# Patient Record
Sex: Female | Born: 1935 | Race: White | Hispanic: No | Smoking: Never smoker
Health system: Southern US, Community
[De-identification: ages and names within clinical notes are randomized; demographics above are authoritative.]

## PROBLEM LIST (undated history)

## (undated) DIAGNOSIS — J45909 Unspecified asthma, uncomplicated: Secondary | ICD-10-CM

## (undated) DIAGNOSIS — I10 Essential (primary) hypertension: Secondary | ICD-10-CM

## (undated) DIAGNOSIS — K5790 Diverticulosis of intestine, part unspecified, without perforation or abscess without bleeding: Secondary | ICD-10-CM

## (undated) DIAGNOSIS — Z9981 Dependence on supplemental oxygen: Secondary | ICD-10-CM

## (undated) DIAGNOSIS — I4891 Unspecified atrial fibrillation: Secondary | ICD-10-CM

## (undated) DIAGNOSIS — I2510 Atherosclerotic heart disease of native coronary artery without angina pectoris: Secondary | ICD-10-CM

## (undated) DIAGNOSIS — I509 Heart failure, unspecified: Secondary | ICD-10-CM

## (undated) DIAGNOSIS — F0390 Unspecified dementia without behavioral disturbance: Secondary | ICD-10-CM

## (undated) HISTORY — PX: ABDOMINAL HYSTERECTOMY: SUR658

## (undated) HISTORY — PX: BREAST LUMPECTOMY: SHX2

---

## 2020-04-18 ENCOUNTER — Inpatient Hospital Stay (HOSPITAL_COMMUNITY)
Admission: EM | Admit: 2020-04-18 | Discharge: 2020-04-26 | DRG: 981 | Disposition: E | Payer: Medicare Other | Attending: Neurology | Admitting: Neurology

## 2020-04-18 ENCOUNTER — Other Ambulatory Visit: Payer: Self-pay

## 2020-04-18 DIAGNOSIS — I110 Hypertensive heart disease with heart failure: Secondary | ICD-10-CM | POA: Diagnosis present

## 2020-04-18 DIAGNOSIS — F0390 Unspecified dementia without behavioral disturbance: Secondary | ICD-10-CM | POA: Diagnosis present

## 2020-04-18 DIAGNOSIS — K5731 Diverticulosis of large intestine without perforation or abscess with bleeding: Principal | ICD-10-CM | POA: Diagnosis present

## 2020-04-18 DIAGNOSIS — J45909 Unspecified asthma, uncomplicated: Secondary | ICD-10-CM | POA: Diagnosis present

## 2020-04-18 DIAGNOSIS — Z66 Do not resuscitate: Secondary | ICD-10-CM | POA: Diagnosis not present

## 2020-04-18 DIAGNOSIS — R278 Other lack of coordination: Secondary | ICD-10-CM | POA: Diagnosis not present

## 2020-04-18 DIAGNOSIS — Z20822 Contact with and (suspected) exposure to covid-19: Secondary | ICD-10-CM | POA: Diagnosis present

## 2020-04-18 DIAGNOSIS — I63511 Cerebral infarction due to unspecified occlusion or stenosis of right middle cerebral artery: Secondary | ICD-10-CM | POA: Diagnosis present

## 2020-04-18 DIAGNOSIS — R471 Dysarthria and anarthria: Secondary | ICD-10-CM | POA: Diagnosis not present

## 2020-04-18 DIAGNOSIS — G8194 Hemiplegia, unspecified affecting left nondominant side: Secondary | ICD-10-CM | POA: Diagnosis not present

## 2020-04-18 DIAGNOSIS — R7989 Other specified abnormal findings of blood chemistry: Secondary | ICD-10-CM | POA: Diagnosis present

## 2020-04-18 DIAGNOSIS — E871 Hypo-osmolality and hyponatremia: Secondary | ICD-10-CM

## 2020-04-18 DIAGNOSIS — Z515 Encounter for palliative care: Secondary | ICD-10-CM | POA: Diagnosis not present

## 2020-04-18 DIAGNOSIS — Z79899 Other long term (current) drug therapy: Secondary | ICD-10-CM

## 2020-04-18 DIAGNOSIS — N289 Disorder of kidney and ureter, unspecified: Secondary | ICD-10-CM | POA: Diagnosis present

## 2020-04-18 DIAGNOSIS — R29810 Facial weakness: Secondary | ICD-10-CM | POA: Diagnosis not present

## 2020-04-18 DIAGNOSIS — I4821 Permanent atrial fibrillation: Secondary | ICD-10-CM

## 2020-04-18 DIAGNOSIS — E039 Hypothyroidism, unspecified: Secondary | ICD-10-CM | POA: Diagnosis present

## 2020-04-18 DIAGNOSIS — Z7951 Long term (current) use of inhaled steroids: Secondary | ICD-10-CM

## 2020-04-18 DIAGNOSIS — I63411 Cerebral infarction due to embolism of right middle cerebral artery: Secondary | ICD-10-CM | POA: Diagnosis not present

## 2020-04-18 DIAGNOSIS — I6601 Occlusion and stenosis of right middle cerebral artery: Secondary | ICD-10-CM | POA: Diagnosis present

## 2020-04-18 DIAGNOSIS — I739 Peripheral vascular disease, unspecified: Secondary | ICD-10-CM | POA: Diagnosis present

## 2020-04-18 DIAGNOSIS — J9601 Acute respiratory failure with hypoxia: Secondary | ICD-10-CM | POA: Diagnosis not present

## 2020-04-18 DIAGNOSIS — I509 Heart failure, unspecified: Secondary | ICD-10-CM | POA: Diagnosis present

## 2020-04-18 DIAGNOSIS — K5790 Diverticulosis of intestine, part unspecified, without perforation or abscess without bleeding: Secondary | ICD-10-CM | POA: Diagnosis present

## 2020-04-18 DIAGNOSIS — Z91041 Radiographic dye allergy status: Secondary | ICD-10-CM

## 2020-04-18 DIAGNOSIS — Z79890 Hormone replacement therapy (postmenopausal): Secondary | ICD-10-CM

## 2020-04-18 DIAGNOSIS — K922 Gastrointestinal hemorrhage, unspecified: Secondary | ICD-10-CM

## 2020-04-18 DIAGNOSIS — I4891 Unspecified atrial fibrillation: Secondary | ICD-10-CM | POA: Diagnosis present

## 2020-04-18 DIAGNOSIS — D62 Acute posthemorrhagic anemia: Secondary | ICD-10-CM | POA: Diagnosis present

## 2020-04-18 DIAGNOSIS — D500 Iron deficiency anemia secondary to blood loss (chronic): Secondary | ICD-10-CM

## 2020-04-18 DIAGNOSIS — Z9981 Dependence on supplemental oxygen: Secondary | ICD-10-CM

## 2020-04-18 DIAGNOSIS — E785 Hyperlipidemia, unspecified: Secondary | ICD-10-CM | POA: Diagnosis present

## 2020-04-18 DIAGNOSIS — R29706 NIHSS score 6: Secondary | ICD-10-CM | POA: Diagnosis not present

## 2020-04-18 DIAGNOSIS — I10 Essential (primary) hypertension: Secondary | ICD-10-CM | POA: Diagnosis present

## 2020-04-18 DIAGNOSIS — R4701 Aphasia: Secondary | ICD-10-CM | POA: Diagnosis not present

## 2020-04-18 DIAGNOSIS — Z0189 Encounter for other specified special examinations: Secondary | ICD-10-CM

## 2020-04-18 DIAGNOSIS — Z7982 Long term (current) use of aspirin: Secondary | ICD-10-CM

## 2020-04-18 DIAGNOSIS — T81509A Unspecified complication of foreign body accidentally left in body following unspecified procedure, initial encounter: Secondary | ICD-10-CM

## 2020-04-18 DIAGNOSIS — D75839 Thrombocytosis, unspecified: Secondary | ICD-10-CM

## 2020-04-18 DIAGNOSIS — I6340 Cerebral infarction due to embolism of unspecified cerebral artery: Secondary | ICD-10-CM | POA: Diagnosis not present

## 2020-04-18 DIAGNOSIS — I639 Cerebral infarction, unspecified: Secondary | ICD-10-CM

## 2020-04-18 DIAGNOSIS — I2510 Atherosclerotic heart disease of native coronary artery without angina pectoris: Secondary | ICD-10-CM | POA: Diagnosis present

## 2020-04-18 HISTORY — DX: Heart failure, unspecified: I50.9

## 2020-04-18 HISTORY — DX: Atherosclerotic heart disease of native coronary artery without angina pectoris: I25.10

## 2020-04-18 HISTORY — DX: Diverticulosis of intestine, part unspecified, without perforation or abscess without bleeding: K57.90

## 2020-04-18 HISTORY — DX: Unspecified atrial fibrillation: I48.91

## 2020-04-18 HISTORY — DX: Dependence on supplemental oxygen: Z99.81

## 2020-04-18 HISTORY — DX: Unspecified asthma, uncomplicated: J45.909

## 2020-04-18 HISTORY — DX: Unspecified dementia, unspecified severity, without behavioral disturbance, psychotic disturbance, mood disturbance, and anxiety: F03.90

## 2020-04-18 HISTORY — DX: Essential (primary) hypertension: I10

## 2020-04-18 NOTE — ED Triage Notes (Signed)
Pt reports GI bleeding and weakness.  According to family member, pt has had several hospitalizations for the same in the past three weeks, stating lowest hgb was 4.1.  Pt does not demonstrate any neuro deficits at this time.  She was taken off her blood thinners 1 week ago.

## 2020-04-19 ENCOUNTER — Telehealth: Payer: Self-pay

## 2020-04-19 ENCOUNTER — Encounter (HOSPITAL_COMMUNITY): Payer: Self-pay | Admitting: Internal Medicine

## 2020-04-19 DIAGNOSIS — Z9981 Dependence on supplemental oxygen: Secondary | ICD-10-CM

## 2020-04-19 DIAGNOSIS — D62 Acute posthemorrhagic anemia: Secondary | ICD-10-CM | POA: Diagnosis present

## 2020-04-19 DIAGNOSIS — I2510 Atherosclerotic heart disease of native coronary artery without angina pectoris: Secondary | ICD-10-CM | POA: Diagnosis present

## 2020-04-19 DIAGNOSIS — K5731 Diverticulosis of large intestine without perforation or abscess with bleeding: Secondary | ICD-10-CM | POA: Diagnosis present

## 2020-04-19 DIAGNOSIS — E871 Hypo-osmolality and hyponatremia: Secondary | ICD-10-CM | POA: Diagnosis present

## 2020-04-19 DIAGNOSIS — F0390 Unspecified dementia without behavioral disturbance: Secondary | ICD-10-CM | POA: Diagnosis present

## 2020-04-19 DIAGNOSIS — J45909 Unspecified asthma, uncomplicated: Secondary | ICD-10-CM | POA: Diagnosis present

## 2020-04-19 DIAGNOSIS — I110 Hypertensive heart disease with heart failure: Secondary | ICD-10-CM | POA: Diagnosis present

## 2020-04-19 DIAGNOSIS — N289 Disorder of kidney and ureter, unspecified: Secondary | ICD-10-CM | POA: Diagnosis present

## 2020-04-19 DIAGNOSIS — D500 Iron deficiency anemia secondary to blood loss (chronic): Secondary | ICD-10-CM

## 2020-04-19 DIAGNOSIS — E039 Hypothyroidism, unspecified: Secondary | ICD-10-CM | POA: Diagnosis present

## 2020-04-19 DIAGNOSIS — I63511 Cerebral infarction due to unspecified occlusion or stenosis of right middle cerebral artery: Secondary | ICD-10-CM | POA: Diagnosis not present

## 2020-04-19 DIAGNOSIS — G8194 Hemiplegia, unspecified affecting left nondominant side: Secondary | ICD-10-CM | POA: Diagnosis not present

## 2020-04-19 DIAGNOSIS — I509 Heart failure, unspecified: Secondary | ICD-10-CM

## 2020-04-19 DIAGNOSIS — Z66 Do not resuscitate: Secondary | ICD-10-CM | POA: Diagnosis not present

## 2020-04-19 DIAGNOSIS — K5790 Diverticulosis of intestine, part unspecified, without perforation or abscess without bleeding: Secondary | ICD-10-CM | POA: Diagnosis not present

## 2020-04-19 DIAGNOSIS — R4701 Aphasia: Secondary | ICD-10-CM | POA: Diagnosis not present

## 2020-04-19 DIAGNOSIS — J9601 Acute respiratory failure with hypoxia: Secondary | ICD-10-CM | POA: Diagnosis not present

## 2020-04-19 DIAGNOSIS — K922 Gastrointestinal hemorrhage, unspecified: Secondary | ICD-10-CM

## 2020-04-19 DIAGNOSIS — R4182 Altered mental status, unspecified: Secondary | ICD-10-CM | POA: Diagnosis not present

## 2020-04-19 DIAGNOSIS — I4891 Unspecified atrial fibrillation: Secondary | ICD-10-CM | POA: Diagnosis present

## 2020-04-19 DIAGNOSIS — R278 Other lack of coordination: Secondary | ICD-10-CM | POA: Diagnosis not present

## 2020-04-19 DIAGNOSIS — Z20822 Contact with and (suspected) exposure to covid-19: Secondary | ICD-10-CM | POA: Diagnosis present

## 2020-04-19 DIAGNOSIS — E785 Hyperlipidemia, unspecified: Secondary | ICD-10-CM | POA: Diagnosis present

## 2020-04-19 DIAGNOSIS — I739 Peripheral vascular disease, unspecified: Secondary | ICD-10-CM | POA: Diagnosis present

## 2020-04-19 DIAGNOSIS — I6340 Cerebral infarction due to embolism of unspecified cerebral artery: Secondary | ICD-10-CM | POA: Diagnosis not present

## 2020-04-19 DIAGNOSIS — Z515 Encounter for palliative care: Secondary | ICD-10-CM | POA: Diagnosis not present

## 2020-04-19 DIAGNOSIS — I4821 Permanent atrial fibrillation: Secondary | ICD-10-CM | POA: Diagnosis present

## 2020-04-19 DIAGNOSIS — I63411 Cerebral infarction due to embolism of right middle cerebral artery: Secondary | ICD-10-CM | POA: Diagnosis not present

## 2020-04-19 DIAGNOSIS — R29706 NIHSS score 6: Secondary | ICD-10-CM | POA: Diagnosis not present

## 2020-04-19 DIAGNOSIS — I639 Cerebral infarction, unspecified: Secondary | ICD-10-CM | POA: Diagnosis not present

## 2020-04-19 DIAGNOSIS — I10 Essential (primary) hypertension: Secondary | ICD-10-CM | POA: Diagnosis present

## 2020-04-19 HISTORY — DX: Gastrointestinal hemorrhage, unspecified: K92.2

## 2020-04-19 LAB — CBC
HCT: 23.2 % — ABNORMAL LOW (ref 36.0–46.0)
HCT: 30.6 % — ABNORMAL LOW (ref 36.0–46.0)
Hemoglobin: 7.2 g/dL — ABNORMAL LOW (ref 12.0–15.0)
Hemoglobin: 9.9 g/dL — ABNORMAL LOW (ref 12.0–15.0)
MCH: 28.9 pg (ref 26.0–34.0)
MCH: 29.8 pg (ref 26.0–34.0)
MCHC: 31 g/dL (ref 30.0–36.0)
MCHC: 32.4 g/dL (ref 30.0–36.0)
MCV: 89.5 fL (ref 80.0–100.0)
MCV: 95.9 fL (ref 80.0–100.0)
Platelets: 336 10*3/uL (ref 150–400)
Platelets: 413 10*3/uL — ABNORMAL HIGH (ref 150–400)
RBC: 2.42 MIL/uL — ABNORMAL LOW (ref 3.87–5.11)
RBC: 3.42 MIL/uL — ABNORMAL LOW (ref 3.87–5.11)
RDW: 15.2 % (ref 11.5–15.5)
RDW: 16.2 % — ABNORMAL HIGH (ref 11.5–15.5)
WBC: 6.9 10*3/uL (ref 4.0–10.5)
WBC: 7.8 10*3/uL (ref 4.0–10.5)
nRBC: 0 % (ref 0.0–0.2)
nRBC: 0 % (ref 0.0–0.2)

## 2020-04-19 LAB — RESPIRATORY PANEL BY RT PCR (FLU A&B, COVID)
Influenza A by PCR: NEGATIVE
Influenza B by PCR: NEGATIVE
SARS Coronavirus 2 by RT PCR: NEGATIVE

## 2020-04-19 LAB — TSH: TSH: 6.325 u[IU]/mL — ABNORMAL HIGH (ref 0.350–4.500)

## 2020-04-19 LAB — BASIC METABOLIC PANEL
Anion gap: 10 (ref 5–15)
BUN: 21 mg/dL (ref 8–23)
CO2: 27 mmol/L (ref 22–32)
Calcium: 9.1 mg/dL (ref 8.9–10.3)
Chloride: 95 mmol/L — ABNORMAL LOW (ref 98–111)
Creatinine, Ser: 1.25 mg/dL — ABNORMAL HIGH (ref 0.44–1.00)
GFR calc Af Amer: 45 mL/min — ABNORMAL LOW (ref >60)
GFR calc non Af Amer: 39 mL/min — ABNORMAL LOW (ref >60)
Glucose, Bld: 119 mg/dL — ABNORMAL HIGH (ref 70–99)
Potassium: 4.7 mmol/L (ref 3.5–5.1)
Sodium: 132 mmol/L — ABNORMAL LOW (ref 135–145)

## 2020-04-19 LAB — HEMOGLOBIN AND HEMATOCRIT, BLOOD
HCT: 22.7 % — ABNORMAL LOW (ref 36.0–46.0)
Hemoglobin: 6.8 g/dL — CL (ref 12.0–15.0)

## 2020-04-19 LAB — DIGOXIN LEVEL: Digoxin Level: 2 ng/mL (ref 1.0–2.0)

## 2020-04-19 LAB — ABO/RH: ABO/RH(D): O POS

## 2020-04-19 LAB — PREPARE RBC (CROSSMATCH)

## 2020-04-19 LAB — PROTIME-INR
INR: 1 (ref 0.8–1.2)
Prothrombin Time: 13.1 seconds (ref 11.4–15.2)

## 2020-04-19 LAB — POC OCCULT BLOOD, ED: Fecal Occult Bld: POSITIVE — AB

## 2020-04-19 MED ORDER — ACETAMINOPHEN 650 MG RE SUPP: 650.0000 mg | Freq: Four times a day (QID) | RECTAL | Status: DC | PRN

## 2020-04-19 MED ORDER — PEG-KCL-NACL-NASULF-NA ASC-C 100 G PO SOLR: 1.0000 | Freq: Once | ORAL | Status: DC

## 2020-04-19 MED ORDER — HYDRALAZINE HCL 20 MG/ML IJ SOLN
5.0000 mg | INTRAMUSCULAR | Status: DC | PRN
  Administered 2020-04-20: 5 mg via INTRAVENOUS
  Filled 2020-04-19: qty 1

## 2020-04-19 MED ORDER — FLUOXETINE HCL 20 MG PO CAPS
40.0000 mg | ORAL_CAPSULE | Freq: Every day | ORAL | Status: DC
  Administered 2020-04-19 – 2020-04-22 (×3): 40 mg via ORAL
  Filled 2020-04-19 (×4): qty 2

## 2020-04-19 MED ORDER — SODIUM CHLORIDE 0.9 % IV SOLN
10.0000 mL/h | Freq: Once | INTRAVENOUS | Status: AC
  Administered 2020-04-19: 10 mL/h via INTRAVENOUS

## 2020-04-19 MED ORDER — LEVOTHYROXINE SODIUM 88 MCG PO TABS
88.0000 ug | ORAL_TABLET | Freq: Every day | ORAL | Status: DC
  Administered 2020-04-20 – 2020-04-23 (×3): 88 ug via ORAL
  Filled 2020-04-19 (×3): qty 1

## 2020-04-19 MED ORDER — ALBUTEROL SULFATE HFA 108 (90 BASE) MCG/ACT IN AERS
2.0000 | INHALATION_SPRAY | RESPIRATORY_TRACT | Status: DC | PRN
  Filled 2020-04-19: qty 6.7

## 2020-04-19 MED ORDER — DIGOXIN 125 MCG PO TABS
0.1250 mg | ORAL_TABLET | Freq: Every day | ORAL | Status: DC
  Administered 2020-04-19: 0.125 mg via ORAL
  Filled 2020-04-19: qty 1

## 2020-04-19 MED ORDER — PEG-KCL-NACL-NASULF-NA ASC-C 100 G PO SOLR
0.5000 | Freq: Once | ORAL | Status: AC
  Administered 2020-04-19: 100 g via ORAL
  Filled 2020-04-19: qty 1

## 2020-04-19 MED ORDER — PRAVASTATIN SODIUM 40 MG PO TABS
40.0000 mg | ORAL_TABLET | Freq: Every day | ORAL | Status: DC
  Administered 2020-04-19 – 2020-04-23 (×5): 40 mg via ORAL
  Filled 2020-04-19 (×5): qty 1

## 2020-04-19 MED ORDER — BISOPROLOL FUMARATE 5 MG PO TABS
10.0000 mg | ORAL_TABLET | Freq: Every day | ORAL | Status: DC
  Administered 2020-04-19: 10 mg via ORAL
  Filled 2020-04-19: qty 1

## 2020-04-19 MED ORDER — LACTATED RINGERS IV SOLN: INTRAVENOUS | Status: DC

## 2020-04-19 MED ORDER — ONDANSETRON HCL 4 MG/2ML IJ SOLN: 4.0000 mg | Freq: Four times a day (QID) | INTRAMUSCULAR | Status: DC | PRN

## 2020-04-19 MED ORDER — PANTOPRAZOLE SODIUM 40 MG IV SOLR
40.0000 mg | INTRAVENOUS | Status: DC
  Administered 2020-04-19 – 2020-04-24 (×6): 40 mg via INTRAVENOUS
  Filled 2020-04-19 (×6): qty 40

## 2020-04-19 MED ORDER — SIMETHICONE 80 MG PO CHEW
80.0000 mg | CHEWABLE_TABLET | Freq: Four times a day (QID) | ORAL | Status: DC | PRN
  Filled 2020-04-19: qty 1

## 2020-04-19 MED ORDER — ACETAMINOPHEN 325 MG PO TABS
650.0000 mg | ORAL_TABLET | Freq: Four times a day (QID) | ORAL | Status: DC | PRN
  Administered 2020-04-19 – 2020-04-23 (×6): 650 mg via ORAL
  Filled 2020-04-19 (×6): qty 2

## 2020-04-19 MED ORDER — MOMETASONE FURO-FORMOTEROL FUM 200-5 MCG/ACT IN AERO
2.0000 | INHALATION_SPRAY | Freq: Two times a day (BID) | RESPIRATORY_TRACT | Status: DC
  Administered 2020-04-19 – 2020-04-23 (×6): 2 via RESPIRATORY_TRACT
  Filled 2020-04-19 (×2): qty 8.8

## 2020-04-19 MED ORDER — MORPHINE SULFATE (PF) 2 MG/ML IV SOLN: 2.0000 mg | INTRAVENOUS | Status: DC | PRN

## 2020-04-19 MED ORDER — FUROSEMIDE 10 MG/ML IJ SOLN
20.0000 mg | Freq: Once | INTRAMUSCULAR | Status: AC
  Administered 2020-04-19: 20 mg via INTRAVENOUS
  Filled 2020-04-19: qty 2

## 2020-04-19 MED ORDER — ONDANSETRON HCL 4 MG PO TABS: 4.0000 mg | ORAL_TABLET | Freq: Four times a day (QID) | ORAL | Status: DC | PRN

## 2020-04-19 NOTE — Social Work (Signed)
CSW acknowledging consult for SNF placement. Will follow for therapy recommendations needed to best determine disposition/for insurance authorization.   Amelia Macken, MSW, LCSW [facility]    

## 2020-04-19 NOTE — ED Notes (Signed)
Blood Bank has called this RN informing that units of blood for pt is ready & available.

## 2020-04-19 NOTE — Consult Note (Signed)
Consultation  Referring Provider: TRH/Yates  primary Care Physician:  Alben Deeds, MD Primary Gastroenterologist:  [facility]  Reason for Consultation: GI bleed  HPI: Victoria Oconnell is a 86 y.o. female, from [place], who was admitted through the emergency room last p.m. after being brought in by her family with complaints of weakness and lightheadedness and concerns for persistent bleeding. Patient has history of coronary artery disease status post previous stents, congestive heart failure, nocturnal oxygen dependence, and history of atrial fibrillation for which she had been on Xarelto. He has had 2 very recent admissions in [place].  The first admission was about 3 weeks ago with acute presumed lower GI bleeding with bloody stools.  Records are not available as yet, patient's daughter-in-law gives history. She underwent colonoscopy and EGD and colonoscopy with the only finding of diverticulosis.  She was supposed to remain off of Xarelto but inadvertently started taking Xarelto after she was discharged and had readmission about 2 weeks ago with hemoglobin down to 4.9.  By daughter-in-law's report patient underwent 2 colonoscopies during that admission, did have a polyp removed and again no finding other than diverticulosis.  She did not have bleeding scan or angiography.  She was discharged last weekend.  Hemoglobin was 9.5 on discharge. she had follow-up CBC through her PCP on Tuesday, 04/16/2020 and hemoglobin above 8. She has not been on anticoagulation since this last discharge.  Patient called her daughter-in-law yesterday afternoon stating that she was not feeling well, was feeling more weak, and lightheaded and was worried that her blood pressure was low.  Her daughter-in-law decided to bring her to [facility] for further evaluation.  Patient has not been aware of having any obvious rectal bleeding over the past week but thinks that her stools have been dark.  On  admit last p.m. hemoglobin 7.2/hematocrit 23.2, this a.m. hemoglobin down to 6.8. She is receiving 2 units of packed RBCs. She has had 1 bowel movement since admission-patient unaware if any frank blood. She denies any abdominal pain or discomfort, no nausea or vomiting.  She has been gassy. Hemodynamically stable since admission.    Past Medical History:  Diagnosis Date  . Asthma   . Atrial fibrillation (HCC)   . CAD (coronary artery disease)    has stent  . CHF (congestive heart failure) (HCC)   . Dementia (HCC)    mild  . Dependence on nocturnal oxygen therapy    uncertain etiology  . Diverticulosis   . Essential hypertension     Past Surgical History:  Procedure Laterality Date  . ABDOMINAL HYSTERECTOMY    . BREAST LUMPECTOMY      Prior to Admission medications   Medication Sig Start Date End Date Taking? Authorizing Provider  albuterol (VENTOLIN HFA) 108 (90 Base) MCG/ACT inhaler Inhale 2 puffs into the lungs every 4 (four) hours as needed for wheezing or shortness of breath.   Yes [provider]  aspirin EC 81 MG tablet Take 81 mg by mouth daily. Swallow whole.   Yes [provider]  bisoprolol (ZEBETA) 10 MG tablet Take 10 mg by mouth daily.   Yes [provider]  budesonide-formoterol (SYMBICORT) 160-4.5 MCG/ACT inhaler Inhale 2 puffs into the lungs 2 (two) times daily.   Yes [provider]  digoxin (LANOXIN) 0.125 MG tablet Take 0.125 mg by mouth daily.   Yes [provider]  FLUoxetine (PROZAC) 40 MG capsule Take 40 mg by mouth daily.   Yes [provider]  fluticasone (FLOVENT DISKUS) 50 MCG/BLIST diskus inhaler Inhale 2 puffs into the lungs 2 (two) times daily.   Yes [provider]  furosemide (LASIX) 20 MG tablet Take 20 mg by mouth daily.   Yes [provider]  levothyroxine (SYNTHROID) 88 MCG tablet Take 88 mcg by mouth daily before breakfast.   Yes [provider]  losartan  (COZAAR) 50 MG tablet Take 50 mg by mouth daily.   Yes [provider]  magnesium hydroxide (MILK OF MAGNESIA) 400 MG/5ML suspension Take 30 mLs by mouth daily as needed for mild constipation.   Yes [provider]  omeprazole (PRILOSEC) 20 MG capsule Take 20 mg by mouth daily.   Yes [provider]  pravastatin (PRAVACHOL) 40 MG tablet Take 40 mg by mouth at bedtime.   Yes [provider]  simethicone (MYLICON) 125 MG chewable tablet Chew 125 mg by mouth 3 (three) times daily as needed for flatulence.   Yes [provider]  vitamin B-12 (CYANOCOBALAMIN) 100 MCG tablet Take 100 mcg by mouth daily.   Yes [provider]    Current Facility-Administered Medications  Medication Dose Route Frequency Provider Last Rate Last Admin  . acetaminophen (TYLENOL) tablet 650 mg  650 mg Oral Q6H PRN Jonah Blue, MD       Or  . acetaminophen (TYLENOL) suppository 650 mg  650 mg Rectal Q6H PRN Jonah Blue, MD      . albuterol (VENTOLIN HFA) 108 (90 Base) MCG/ACT inhaler 2 puff  2 puff Inhalation Q4H PRN Jonah Blue, MD      . bisoprolol (ZEBETA) tablet 10 mg  10 mg Oral Daily Jonah Blue, MD   10 mg at 04/19/20 1111  . digoxin (LANOXIN) tablet 0.125 mg  0.125 mg Oral Daily Jonah Blue, MD   0.125 mg at 04/19/20 0913  . FLUoxetine (PROZAC) capsule 40 mg  40 mg Oral Daily Jonah Blue, MD   40 mg at 04/19/20 1111  . hydrALAZINE (APRESOLINE) injection 5 mg  5 mg Intravenous Q4H PRN Jonah Blue, MD      . lactated ringers infusion   Intravenous Continuous Jonah Blue, MD 75 mL/hr at 04/19/20 0930 New Bag/Given (Non-Interop) at 04/19/20 0930  . [START ON 04/15/2020] levothyroxine (SYNTHROID) tablet 88 mcg  88 mcg Oral QAC breakfast Jonah Blue, MD      . mometasone-formoterol [facility]) 200-5 MCG/ACT inhaler 2 puff  2 puff Inhalation BID Jonah Blue, MD   2 puff at 04/19/20 1112  . morphine 2 MG/ML injection 2 mg  2 mg  Intravenous Q2H PRN Jonah Blue, MD      . ondansetron [facility]) tablet 4 mg  4 mg Oral Q6H PRN Jonah Blue, MD       Or  . ondansetron [facility]) injection 4 mg  4 mg Intravenous Q6H PRN Jonah Blue, MD      . pantoprazole (PROTONIX) injection 40 mg  40 mg Intravenous Q24H Jonah Blue, MD   40 mg at 04/19/20 0929  . pravastatin (PRAVACHOL) tablet 40 mg  40 mg Oral QHS Jonah Blue, MD      . simethicone [facility]) chewable tablet 80 mg  80 mg Oral Q6H PRN Jonah Blue, MD       Current Outpatient Medications  Medication Sig Dispense Refill  . albuterol (VENTOLIN HFA) 108 (90 Base) MCG/ACT inhaler Inhale 2 puffs into the lungs every 4 (four) hours as needed for wheezing or shortness of breath.    Marland Kitchen aspirin  EC 81 MG tablet Take 81 mg by mouth daily. Swallow whole.    . bisoprolol (ZEBETA) 10 MG tablet Take 10 mg by mouth daily.    . budesonide-formoterol (SYMBICORT) 160-4.5 MCG/ACT inhaler Inhale 2 puffs into the lungs 2 (two) times daily.    . digoxin (LANOXIN) 0.125 MG tablet Take 0.125 mg by mouth daily.    Marland Kitchen. FLUoxetine (PROZAC) 40 MG capsule Take 40 mg by mouth daily.    . fluticasone (FLOVENT DISKUS) 50 MCG/BLIST diskus inhaler Inhale 2 puffs into the lungs 2 (two) times daily.    . furosemide (LASIX) 20 MG tablet Take 20 mg by mouth daily.    Marland Kitchen. levothyroxine (SYNTHROID) 88 MCG tablet Take 88 mcg by mouth daily before breakfast.    . losartan (COZAAR) 50 MG tablet Take 50 mg by mouth daily.    . magnesium hydroxide (MILK OF MAGNESIA) 400 MG/5ML suspension Take 30 mLs by mouth daily as needed for mild constipation.    Marland Kitchen. omeprazole (PRILOSEC) 20 MG capsule Take 20 mg by mouth daily.    . pravastatin (PRAVACHOL) 40 MG tablet Take 40 mg by mouth at bedtime.    . simethicone (MYLICON) 125 MG chewable tablet Chew 125 mg by mouth 3 (three) times daily as needed for flatulence.    . vitamin B-12 (CYANOCOBALAMIN) 100 MCG tablet Take 100 mcg by mouth daily.      Allergies as of  03/27/2020  . (Not on File)    History reviewed. No pertinent family history.  Social History   Socioeconomic History  . Marital status: Widowed    Spouse name: Not on file  . Number of children: Not on file  . Years of education: Not on file  . Highest education level: Not on file  Occupational History  . Occupation: retired  Tobacco Use  . Smoking status: Never Smoker  . Smokeless tobacco: Never Used  Substance and Sexual Activity  . Alcohol use: Never  . Drug use: Never  . Sexual activity: Not on file  Other Topics Concern  . Not on file  Social History Narrative  . Not on file   Social Determinants of Health   Financial Resource Strain:   . Difficulty of Paying Living Expenses: Not on file  Food Insecurity:   . Worried About Programme researcher, broadcasting/film/videounning Out of Food in the Last Year: Not on file  . Ran Out of Food in the Last Year: Not on file  Transportation Needs:   . Lack of Transportation (Medical): Not on file  . Lack of Transportation (Non-Medical): Not on file  Physical Activity:   . Days of Exercise per Week: Not on file  . Minutes of Exercise per Session: Not on file  Stress:   . Feeling of Stress : Not on file  Social Connections:   . Frequency of Communication with Friends and Family: Not on file  . Frequency of Social Gatherings with Friends and Family: Not on file  . Attends Religious Services: Not on file  . Active Member of Clubs or Organizations: Not on file  . Attends BankerClub or Organization Meetings: Not on file  . Marital Status: Not on file  Intimate Partner Violence:   . Fear of Current or Ex-Partner: Not on file  . Emotionally Abused: Not on file  . Physically Abused: Not on file  . Sexually Abused: Not on file    Review of Systems: Pertinent positive and negative review of systems were noted in the above HPI section.  All other review of systems was otherwise negative.  Physical Exam: Vital signs in last 24 hours: Temp:  [97.9 F (36.6 C)-98.3 F (36.8  C)] 98.3 F (36.8 C) (09/24 1100) Pulse Rate:  [45-109] 66 (09/24 1115) Resp:  [11-26] 12 (09/24 1115) BP: (108-156)/(42-113) 136/113 (09/24 1115) SpO2:  [95 %-100 %] 99 % (09/24 1115)   General:   Alert,  Well-developed, well-nourished, thin elderly white female pleasant and cooperative in NAD, fatigued appearing Head:  Normocephalic and atraumatic. Eyes:  Sclera clear, no icterus.   Conjunctiva pale Ears:  Normal auditory acuity. Nose:  No deformity, discharge,  or lesions. Mouth:  No deformity or lesions.   Neck:  Supple; no masses or thyromegaly. Lungs:  Clear throughout to auscultation.   No wheezes, crackles, or rhonchi. Heart:  irRegular rate and rhythm; no murmurs, clicks, rubs,  or gallops. Abdomen:  Soft,nontender, BS hyperactive,,nonpalp mass or hsm.   Rectal:  Deferred  Msk:  Symmetrical without gross deformities. . Pulses:  Normal pulses noted. Extremities:  Without clubbing or edema. Neurologic:  Alert and  oriented x4;  grossly normal neurologically. Skin:  Intact without significant lesions or rashes.. Psych:  Alert and cooperative. Normal mood and affect.  Intake/Output from previous day: No intake/output data recorded. Intake/Output this shift: Total I/O In: 315 [Blood:315] Out: -   Lab Results: Recent Labs    04/19/20 0000 04/19/20 0455  WBC 7.8  --   HGB 7.2* 6.8*  HCT 23.2* 22.7*  PLT 413*  --    BMET Recent Labs    04/19/20 0000  NA 132*  K 4.7  CL 95*  CO2 27  GLUCOSE 119*  BUN 21  CREATININE 1.25*  CALCIUM 9.1   LFT No results for input(s): PROT, ALBUMIN, AST, ALT, ALKPHOS, BILITOT, BILIDIR, IBILI in the last 72 hours. PT/INR Recent Labs    04/19/20 0000  LABPROT 13.1  INR 1.0   Hepatitis Panel No results for input(s): HEPBSAG, HCVAB, HEPAIGM, HEPBIGM in the last 72 hours.    IMPRESSION:  #[age] year old white female with probable persistent slow GI bleeding over the past 3 weeks.  Hospitalized x2 in [place].   Initially in the setting of anticoagulation which has been stopped over the past week. Patient had endoscopic evaluation during hospitalization in [place] with 2-3 colonoscopies and one EGD with no findings other than diverticulosis.  Bleeding presumed to be diverticular. Over the past week hemoglobin has dropped 2.5 g off anticoagulation.  Patient unaware of any overt bleeding though did notice her stools were dark. Etiology of bleeding is not entirely clear, she may have a small bowel source i.e. AVMs or other small bowel lesion.  She may have stuttering low-grade diverticular bleeding.  #2 anemia secondary to above #3 atrial fibrillation #4 congestive heart failure #5.  Coronary artery disease status post prior stent #6 dementia #7 nocturnal O2 dependence  PLAN: Clear liquids today, n.p.o. after midnight Every 6 hour hemoglobins and transfuse for hemoglobin 7.5 or less She will be scheduled for colonoscopy and enteroscopy with Dr. Elnoria Howard for tomorrow 04/03/2020.  If definitive source not identified then plan for capsule endoscopy with capsule placement after EGD. Meds have been discussed in detail with the patient and her daughter-in-law, indications risks and benefits discussed and they are agreeable to proceed.  Records from [facility] have been requested and will be reviewed.   Zylee Marchiano EsterwoodPA-C  04/19/2020, 11:58 AM

## 2020-04-19 NOTE — Telephone Encounter (Signed)
NOTES ON FILE FROM [facility] [phone], SENT REFERRAL TO SCHEDULING

## 2020-04-19 NOTE — ED Notes (Signed)
E-consent signed in chart

## 2020-04-19 NOTE — ED Provider Notes (Signed)
[facility] EMERGENCY DEPARTMENT Provider Note   CSN: [id number] Arrival date & time: 04/28/20  2305   History Chief Complaint  Patient presents with  . GI Bleeding  . Weakness    Victoria Oconnell is a 86 y.o. female.  The history is provided by the patient and a relative.  Weakness She has a history of atrial fibrillation, currently not on anticoagulation because of recent GI bleed.  She started having problems with weakness and dizziness and rectal bleeding about 3 weeks ago.  She has been admitted twice to the hospital in [facility], and had multiple colonoscopies.  She was discharged about 5 days ago, but continues to have rectal bleeding.  She describes the blood as dark red.  Her dizziness and lightheadedness was worse today, so her daughter brought her here for evaluation.  She denies any abdominal pain, nausea, vomiting.  Her anticoagulant was stopped after her first hospitalization for GI bleed.   No past medical history on file.  There are no problems to display for this patient.   ** The histories are not reviewed yet. Please review them in the "History" navigator section and refresh this SmartLink.   OB History   No obstetric history on file.     No family history on file.  Social History   Tobacco Use  . Smoking status: Not on file  Substance Use Topics  . Alcohol use: Not on file  . Drug use: Not on file    Home Medications Prior to Admission medications   Not on File    Allergies    Contrast media [iodinated diagnostic agents]  Review of Systems   Review of Systems  Neurological: Positive for weakness.  All other systems reviewed and are negative.   Physical Exam Updated Vital Signs BP 139/67 (BP Location: Right Arm)   Pulse 63   Temp 98 F (36.7 C) (Oral)   Resp 14   SpO2 99%   Physical Exam Vitals and nursing note reviewed.   86 year old female, resting comfortably and in no acute distress. Vital signs are  normal. Oxygen saturation is 99%, which is normal. Head is normocephalic and atraumatic. PERRLA, EOMI. Oropharynx is clear.  Conjunctivae are pale. Neck is nontender and supple without adenopathy or JVD. Back is nontender and there is no CVA tenderness. Lungs are clear without rales, wheezes, or rhonchi. Chest is nontender. Heart has irregularly irregular rhythm with 2/6 systolic ejection murmur heard along the left sternal border. Abdomen is soft, flat, nontender without masses or hepatosplenomegaly and peristalsis is normoactive. Rectal: Normal sphincter tone.  Small amount of bright red blood present. Extremities have no cyanosis or edema, full range of motion is present. Skin is warm and dry without rash. Neurologic: Mental status is normal, cranial nerves are intact, there are no motor or sensory deficits.  ED Results / Procedures / Treatments   Labs (all labs ordered are listed, but only abnormal results are displayed) Labs Reviewed  BASIC METABOLIC PANEL - Abnormal; Notable for the following components:      Result Value   Sodium 132 (*)    Chloride 95 (*)    Glucose, Bld 119 (*)    Creatinine, Ser 1.25 (*)    GFR calc non Af Amer 39 (*)    GFR calc Af Amer 45 (*)    All other components within normal limits  CBC - Abnormal; Notable for the following components:   RBC 2.42 (*)  Hemoglobin 7.2 (*)    HCT 23.2 (*)    Platelets 413 (*)    All other components within normal limits  HEMOGLOBIN AND HEMATOCRIT, BLOOD - Abnormal; Notable for the following components:   Hemoglobin 6.8 (*)    HCT 22.7 (*)    All other components within normal limits  POC OCCULT BLOOD, ED - Abnormal; Notable for the following components:   Fecal Occult Bld POSITIVE (*)    All other components within normal limits  RESPIRATORY PANEL BY RT PCR (FLU A&B, COVID)  PROTIME-INR  URINALYSIS, ROUTINE W REFLEX MICROSCOPIC  POC OCCULT BLOOD, ED  CBG MONITORING, ED  TYPE AND SCREEN  ABO/RH  PREPARE  RBC (CROSSMATCH)    EKG EKG Interpretation  Date/Time:  Thursday April 18 2020 23:49:58 EDT Ventricular Rate:  62 PR Interval:    QRS Duration: 78 QT Interval:  368 QTC Calculation: 373 R Axis:   54 Text Interpretation: Atrial fibrillation ST & T wave abnormality, consider inferior ischemia ST & T wave abnormality, consider anterolateral ischemia Abnormal ECG When compared with ECG of EARLIER SAME DATE No significant change was found Confirmed by Dione Booze ([id number]) on 04/19/2020 12:12:31 AM  Procedures Procedures  CRITICAL CARE Performed by: Dione Booze Total critical care time: 40 minutes Critical care time was exclusive of separately billable procedures and treating other patients. Critical care was necessary to treat or prevent imminent or life-threatening deterioration. Critical care was time spent personally by me on the following activities: development of treatment plan with patient and/or surrogate as well as nursing, discussions with consultants, evaluation of patient's response to treatment, examination of patient, obtaining history from patient or surrogate, ordering and performing treatments and interventions, ordering and review of laboratory studies, ordering and review of radiographic studies, pulse oximetry and re-evaluation of patient's condition.  Medications Ordered in ED Medications  0.9 %  sodium chloride infusion (has no administration in time range)    ED Course  I have reviewed the triage vital signs and the nursing notes.  Pertinent lab results that were available during my care of the patient were reviewed by me and considered in my medical decision making (see chart for details).  MDM Rules/Calculators/A&P Persistent rectal bleeding.  Permanent atrial fibrillation, no longer anticoagulated.  She has no prior records in the [facility] health [facility], records from [place] have been requested.  Initial hemoglobin is 7.2, and daughter thinks it was higher than  8 when it was last checked at her primary care provider's office.  Mild renal insufficiency and mild hyponatremia are also present.  Will recheck hemoglobin to see if it is continuing to drop, check orthostatic vital signs.  Records were received from [facility], patient's last recorded hemoglobin was 9.0 on 9/19.  Repeat hemoglobin has dropped to 6.8.  She will be given 2 units of packed red blood cells.  Case is discussed with Dr. Toniann Fail of [facility], who agrees to admit the patient.  Final Clinical Impression(s) / ED Diagnoses Final diagnoses:  Lower gastrointestinal bleeding  Anemia due to blood loss  Hyponatremia  Thrombocytosis (HCC)  Permanent atrial fibrillation [facility])    Rx / DC Orders ED Discharge Orders    None       Dione Booze, MD 04/19/20 [phone]

## 2020-04-19 NOTE — H&P (View-Only) (Signed)
  Consultation  Referring Provider: TRH/Yates  primary Care Physician:  Jinich, David, MD Primary Gastroenterologist:  [facility]  Reason for Consultation: GI bleed  HPI: Victoria Oconnell is a 86 y.o. female, from [place], who was admitted through the emergency room last p.m. after being brought in by her family with complaints of weakness and lightheadedness and concerns for persistent bleeding. Patient has history of coronary artery disease status post previous stents, congestive heart failure, nocturnal oxygen dependence, and history of atrial fibrillation for which she had been on Xarelto. He has had 2 very recent admissions in [place].  The first admission was about 3 weeks ago with acute presumed lower GI bleeding with bloody stools.  Records are not available as yet, patient's daughter-in-law gives history. She underwent colonoscopy and EGD and colonoscopy with the only finding of diverticulosis.  She was supposed to remain off of Xarelto but inadvertently started taking Xarelto after she was discharged and had readmission about 2 weeks ago with hemoglobin down to 4.9.  By daughter-in-law's report patient underwent 2 colonoscopies during that admission, did have a polyp removed and again no finding other than diverticulosis.  She did not have bleeding scan or angiography.  She was discharged last weekend.  Hemoglobin was 9.5 on discharge. she had follow-up CBC through her PCP on Tuesday, 04/16/2020 and hemoglobin above 8. She has not been on anticoagulation since this last discharge.  Patient called her daughter-in-law yesterday afternoon stating that she was not feeling well, was feeling more weak, and lightheaded and was worried that her blood pressure was low.  Her daughter-in-law decided to bring her to [place] for further evaluation.  Patient has not been aware of having any obvious rectal bleeding over the past week but thinks that her stools have been dark.  On  admit last p.m. hemoglobin 7.2/hematocrit 23.2, this a.m. hemoglobin down to 6.8. She is receiving 2 units of packed RBCs. She has had 1 bowel movement since admission-patient unaware if any frank blood. She denies any abdominal pain or discomfort, no nausea or vomiting.  She has been gassy. Hemodynamically stable since admission.    Past Medical History:  Diagnosis Date  . Asthma   . Atrial fibrillation (HCC)   . CAD (coronary artery disease)    has stent  . CHF (congestive heart failure) (HCC)   . Dementia (HCC)    mild  . Dependence on nocturnal oxygen therapy    uncertain etiology  . Diverticulosis   . Essential hypertension     Past Surgical History:  Procedure Laterality Date  . ABDOMINAL HYSTERECTOMY    . BREAST LUMPECTOMY      Prior to Admission medications   Medication Sig Start Date End Date Taking? Authorizing Provider  albuterol (VENTOLIN HFA) 108 (90 Base) MCG/ACT inhaler Inhale 2 puffs into the lungs every 4 (four) hours as needed for wheezing or shortness of breath.   Yes [provider]  aspirin EC 81 MG tablet Take 81 mg by mouth daily. Swallow whole.   Yes [provider]  bisoprolol (ZEBETA) 10 MG tablet Take 10 mg by mouth daily.   Yes [provider]  budesonide-formoterol (SYMBICORT) 160-4.5 MCG/ACT inhaler Inhale 2 puffs into the lungs 2 (two) times daily.   Yes [provider]  digoxin (LANOXIN) 0.125 MG tablet Take 0.125 mg by mouth daily.   Yes [provider]  FLUoxetine (PROZAC) 40 MG capsule Take 40 mg by mouth daily.   Yes [provider]  fluticasone (FLOVENT DISKUS) 50 MCG/BLIST diskus inhaler Inhale 2 puffs into the lungs 2 (two) times daily.   Yes [provider]  furosemide (LASIX) 20 MG tablet Take 20 mg by mouth daily.   Yes [provider]  levothyroxine (SYNTHROID) 88 MCG tablet Take 88 mcg by mouth daily before breakfast.   Yes [provider]  losartan  (COZAAR) 50 MG tablet Take 50 mg by mouth daily.   Yes [provider]  magnesium hydroxide (MILK OF MAGNESIA) 400 MG/5ML suspension Take 30 mLs by mouth daily as needed for mild constipation.   Yes [provider]  omeprazole (PRILOSEC) 20 MG capsule Take 20 mg by mouth daily.   Yes [provider]  pravastatin (PRAVACHOL) 40 MG tablet Take 40 mg by mouth at bedtime.   Yes [provider]  simethicone (MYLICON) 125 MG chewable tablet Chew 125 mg by mouth 3 (three) times daily as needed for flatulence.   Yes [provider]  vitamin B-12 (CYANOCOBALAMIN) 100 MCG tablet Take 100 mcg by mouth daily.   Yes [provider]    Current Facility-Administered Medications  Medication Dose Route Frequency Provider Last Rate Last Admin  . acetaminophen (TYLENOL) tablet 650 mg  650 mg Oral Q6H PRN Yates, Jennifer, MD       Or  . acetaminophen (TYLENOL) suppository 650 mg  650 mg Rectal Q6H PRN Yates, Jennifer, MD      . albuterol (VENTOLIN HFA) 108 (90 Base) MCG/ACT inhaler 2 puff  2 puff Inhalation Q4H PRN Yates, Jennifer, MD      . bisoprolol (ZEBETA) tablet 10 mg  10 mg Oral Daily Yates, Jennifer, MD   10 mg at 04/19/20 1111  . digoxin (LANOXIN) tablet 0.125 mg  0.125 mg Oral Daily Yates, Jennifer, MD   0.125 mg at 04/19/20 0913  . FLUoxetine (PROZAC) capsule 40 mg  40 mg Oral Daily Yates, Jennifer, MD   40 mg at 04/19/20 1111  . hydrALAZINE (APRESOLINE) injection 5 mg  5 mg Intravenous Q4H PRN Yates, Jennifer, MD      . lactated ringers infusion   Intravenous Continuous Yates, Jennifer, MD 75 mL/hr at 04/19/20 0930 New Bag/Given (Non-Interop) at 04/19/20 0930  . [START ON 04/08/2020] levothyroxine (SYNTHROID) tablet 88 mcg  88 mcg Oral QAC breakfast Yates, Jennifer, MD      . mometasone-formoterol (DULERA) 200-5 MCG/ACT inhaler 2 puff  2 puff Inhalation BID Yates, Jennifer, MD   2 puff at 04/19/20 1112  . morphine 2 MG/ML injection 2 mg  2 mg  Intravenous Q2H PRN Yates, Jennifer, MD      . ondansetron (ZOFRAN) tablet 4 mg  4 mg Oral Q6H PRN Yates, Jennifer, MD       Or  . ondansetron (ZOFRAN) injection 4 mg  4 mg Intravenous Q6H PRN Yates, Jennifer, MD      . pantoprazole (PROTONIX) injection 40 mg  40 mg Intravenous Q24H Yates, Jennifer, MD   40 mg at 04/19/20 0929  . pravastatin (PRAVACHOL) tablet 40 mg  40 mg Oral QHS Yates, Jennifer, MD      . simethicone (MYLICON) chewable tablet 80 mg  80 mg Oral Q6H PRN Yates, Jennifer, MD       Current Outpatient Medications  Medication Sig Dispense Refill  . albuterol (VENTOLIN HFA) 108 (90 Base) MCG/ACT inhaler Inhale 2 puffs into the lungs every 4 (four) hours as needed for wheezing or shortness of breath.    . aspirin   EC 81 MG tablet Take 81 mg by mouth daily. Swallow whole.    . bisoprolol (ZEBETA) 10 MG tablet Take 10 mg by mouth daily.    . budesonide-formoterol (SYMBICORT) 160-4.5 MCG/ACT inhaler Inhale 2 puffs into the lungs 2 (two) times daily.    . digoxin (LANOXIN) 0.125 MG tablet Take 0.125 mg by mouth daily.    . FLUoxetine (PROZAC) 40 MG capsule Take 40 mg by mouth daily.    . fluticasone (FLOVENT DISKUS) 50 MCG/BLIST diskus inhaler Inhale 2 puffs into the lungs 2 (two) times daily.    . furosemide (LASIX) 20 MG tablet Take 20 mg by mouth daily.    . levothyroxine (SYNTHROID) 88 MCG tablet Take 88 mcg by mouth daily before breakfast.    . losartan (COZAAR) 50 MG tablet Take 50 mg by mouth daily.    . magnesium hydroxide (MILK OF MAGNESIA) 400 MG/5ML suspension Take 30 mLs by mouth daily as needed for mild constipation.    . omeprazole (PRILOSEC) 20 MG capsule Take 20 mg by mouth daily.    . pravastatin (PRAVACHOL) 40 MG tablet Take 40 mg by mouth at bedtime.    . simethicone (MYLICON) 125 MG chewable tablet Chew 125 mg by mouth 3 (three) times daily as needed for flatulence.    . vitamin B-12 (CYANOCOBALAMIN) 100 MCG tablet Take 100 mcg by mouth daily.      Allergies as of  04/13/2020  . (Not on File)    History reviewed. No pertinent family history.  Social History   Socioeconomic History  . Marital status: Widowed    Spouse name: Not on file  . Number of children: Not on file  . Years of education: Not on file  . Highest education level: Not on file  Occupational History  . Occupation: retired  Tobacco Use  . Smoking status: Never Smoker  . Smokeless tobacco: Never Used  Substance and Sexual Activity  . Alcohol use: Never  . Drug use: Never  . Sexual activity: Not on file  Other Topics Concern  . Not on file  Social History Narrative  . Not on file   Social Determinants of Health   Financial Resource Strain:   . Difficulty of Paying Living Expenses: Not on file  Food Insecurity:   . Worried About Running Out of Food in the Last Year: Not on file  . Ran Out of Food in the Last Year: Not on file  Transportation Needs:   . Lack of Transportation (Medical): Not on file  . Lack of Transportation (Non-Medical): Not on file  Physical Activity:   . Days of Exercise per Week: Not on file  . Minutes of Exercise per Session: Not on file  Stress:   . Feeling of Stress : Not on file  Social Connections:   . Frequency of Communication with Friends and Family: Not on file  . Frequency of Social Gatherings with Friends and Family: Not on file  . Attends Religious Services: Not on file  . Active Member of Clubs or Organizations: Not on file  . Attends Club or Organization Meetings: Not on file  . Marital Status: Not on file  Intimate Partner Violence:   . Fear of Current or Ex-Partner: Not on file  . Emotionally Abused: Not on file  . Physically Abused: Not on file  . Sexually Abused: Not on file    Review of Systems: Pertinent positive and negative review of systems were noted in the above HPI section.    All other review of systems was otherwise negative.  Physical Exam: Vital signs in last 24 hours: Temp:  [97.9 F (36.6 C)-98.3 F (36.8  C)] 98.3 F (36.8 C) (09/24 1100) Pulse Rate:  [45-109] 66 (09/24 1115) Resp:  [11-26] 12 (09/24 1115) BP: (108-156)/(42-113) 136/113 (09/24 1115) SpO2:  [95 %-100 %] 99 % (09/24 1115)   General:   Alert,  Well-developed, well-nourished, thin elderly white female pleasant and cooperative in NAD, fatigued appearing Head:  Normocephalic and atraumatic. Eyes:  Sclera clear, no icterus.   Conjunctiva pale Ears:  Normal auditory acuity. Nose:  No deformity, discharge,  or lesions. Mouth:  No deformity or lesions.   Neck:  Supple; no masses or thyromegaly. Lungs:  Clear throughout to auscultation.   No wheezes, crackles, or rhonchi. Heart:  irRegular rate and rhythm; no murmurs, clicks, rubs,  or gallops. Abdomen:  Soft,nontender, BS hyperactive,,nonpalp mass or hsm.   Rectal:  Deferred  Msk:  Symmetrical without gross deformities. . Pulses:  Normal pulses noted. Extremities:  Without clubbing or edema. Neurologic:  Alert and  oriented x4;  grossly normal neurologically. Skin:  Intact without significant lesions or rashes.. Psych:  Alert and cooperative. Normal mood and affect.  Intake/Output from previous day: No intake/output data recorded. Intake/Output this shift: Total I/O In: 315 [Blood:315] Out: -   Lab Results: Recent Labs    04/19/20 0000 04/19/20 0455  WBC 7.8  --   HGB 7.2* 6.8*  HCT 23.2* 22.7*  PLT 413*  --    BMET Recent Labs    04/19/20 0000  NA 132*  K 4.7  CL 95*  CO2 27  GLUCOSE 119*  BUN 21  CREATININE 1.25*  CALCIUM 9.1   LFT No results for input(s): PROT, ALBUMIN, AST, ALT, ALKPHOS, BILITOT, BILIDIR, IBILI in the last 72 hours. PT/INR Recent Labs    04/19/20 0000  LABPROT 13.1  INR 1.0   Hepatitis Panel No results for input(s): HEPBSAG, HCVAB, HEPAIGM, HEPBIGM in the last 72 hours.    IMPRESSION:  #1 86-year-old white female with probable persistent slow GI bleeding over the past 3 weeks.  Hospitalized x2 in [place].   Initially in the setting of anticoagulation which has been stopped over the past week. Patient had endoscopic evaluation during hospitalization in [place] with 2-3 colonoscopies and one EGD with no findings other than diverticulosis.  Bleeding presumed to be diverticular. Over the past week hemoglobin has dropped 2.5 g off anticoagulation.  Patient unaware of any overt bleeding though did notice her stools were dark. Etiology of bleeding is not entirely clear, she may have a small bowel source i.e. AVMs or other small bowel lesion.  She may have stuttering low-grade diverticular bleeding.  #2 anemia secondary to above #3 atrial fibrillation #4 congestive heart failure #5.  Coronary artery disease status post prior stent #6 dementia #7 nocturnal O2 dependence  PLAN: Clear liquids today, n.p.o. after midnight Every 6 hour hemoglobins and transfuse for hemoglobin 7.5 or less She will be scheduled for colonoscopy and enteroscopy with Dr. Hung for tomorrow 04/02/2020.  If definitive source not identified then plan for capsule endoscopy with capsule placement after EGD. Meds have been discussed in detail with the patient and her daughter-in-law, indications risks and benefits discussed and they are agreeable to proceed.  Records from [place] have been requested and will be reviewed.   Psalms Olarte EsterwoodPA-C  04/19/2020, 11:58 AM   

## 2020-04-19 NOTE — H&P (Addendum)
History and Physical    Victoria Oconnell QIH:[id number] DOB: [birth year] DOA: 04/06/2020  PCP: Alben Deeds, MD Consultants:  Zagel - cardiology; Allena Katz - GIOrson Aloe - pulmonology Patient coming from:  Home - lives with son; NOK: Vedia Coffer, [phone]  Chief Complaint: GI bleeding  HPI: Victoria Oconnell is a 86 y.o. female with medical history significant of afib, previously on Xarelto multiple recent hospitalizations for GI bleeds presenting with the same; Eliquis was stopped 1 week ago for this reason.  She has been having bloody stools.  She has had 2 recent hospital admissions in [place] - 3 weeks ago with scope, on Xarelto for afib, no source identified; she began bleeding again at home and so readmitted and Hgb was 4.9.  She had been inadvertently taking Xarelto.  Scoped twice, found a polyp and diverticulosis.  Were considering a Watchman due to afib and considering a laparoscopic procedure.  Her DIL works for EP here and so they were working on that.  She was discharged on Saturday of last weekend and followed up with PCP.  Hgb was 9.5 at d/c and above 8 Tuesday at PCP f/u.  Last night, she felt weak, tired, BP was 93/62.  She felt felt lightheaded.  She missed her wheelchair during transfer and so they decided to bring her in to [facility].  Initially had EGD and then had 3 subsequent colonoscopies.  She is off the Xarelto; last dose was almost 2 weeks ago.  She never had a tagged RBC scan.  She is not having frank blood - dark stools with some red in it.     ED Course:  Carryover, per Dr. Toniann Fail:  86 year old female who was recently admitted in [place] for GI bleed at the time colonoscopy was done which showed diverticulosis patient's apixaban was discontinued the time was brought to the ER for rectal bleeding again. Hemoglobin at the time of last discharge was around 9 as per the records which patient had brought with her. Has dropped to 7.2 and 6.8 now in the ER. Was started on  transfusion admitted for GI bleed likely from diverticulosis.   Review of Systems: As per HPI; otherwise review of systems reviewed and negative.   Ambulatory Status:  Ambulates with walker, using a wheelchair due to weakness from the bleeding  COVID Vaccine Status:   Complete  Past Medical History:  Diagnosis Date  . Asthma   . Atrial fibrillation (HCC)   . CAD (coronary artery disease)    has stent  . CHF (congestive heart failure) (HCC)   . Dementia (HCC)    mild  . Dependence on nocturnal oxygen therapy    uncertain etiology  . Diverticulosis   . Essential hypertension     Past Surgical History:  Procedure Laterality Date  . ABDOMINAL HYSTERECTOMY    . BREAST LUMPECTOMY      Social History   Socioeconomic History  . Marital status: Widowed    Spouse name: Not on file  . Number of children: Not on file  . Years of education: Not on file  . Highest education level: Not on file  Occupational History  . Occupation: retired  Tobacco Use  . Smoking status: Never Smoker  . Smokeless tobacco: Never Used  Substance and Sexual Activity  . Alcohol use: Never  . Drug use: Never  . Sexual activity: Not on file  Other Topics Concern  . Not on file  Social History Narrative  . Not on file   Social  Determinants of Health   Financial Resource Strain:   . Difficulty of Paying Living Expenses: Not on file  Food Insecurity:   . Worried About Programme researcher, broadcasting/film/videounning Out of Food in the Last Year: Not on file  . Ran Out of Food in the Last Year: Not on file  Transportation Needs:   . Lack of Transportation (Medical): Not on file  . Lack of Transportation (Non-Medical): Not on file  Physical Activity:   . Days of Exercise per Week: Not on file  . Minutes of Exercise per Session: Not on file  Stress:   . Feeling of Stress : Not on file  Social Connections:   . Frequency of Communication with Friends and Family: Not on file  . Frequency of Social Gatherings with Friends and Family: Not  on file  . Attends Religious Services: Not on file  . Active Member of Clubs or Organizations: Not on file  . Attends BankerClub or Organization Meetings: Not on file  . Marital Status: Not on file  Intimate Partner Violence:   . Fear of Current or Ex-Partner: Not on file  . Emotionally Abused: Not on file  . Physically Abused: Not on file  . Sexually Abused: Not on file    Allergies  Allergen Reactions  . Contrast Media [Iodinated Diagnostic Agents]     Pt doesn't remember     History reviewed. No pertinent family history.  Prior to Admission medications   Medication Sig Start Date End Date Taking? Authorizing Provider  albuterol (VENTOLIN HFA) 108 (90 Base) MCG/ACT inhaler Inhale 2 puffs into the lungs every 4 (four) hours as needed for wheezing or shortness of breath.   Yes [provider]  aspirin EC 81 MG tablet Take 81 mg by mouth daily. Swallow whole.   Yes [provider]  bisoprolol (ZEBETA) 10 MG tablet Take 10 mg by mouth daily.   Yes [provider]  budesonide-formoterol (SYMBICORT) 160-4.5 MCG/ACT inhaler Inhale 2 puffs into the lungs 2 (two) times daily.   Yes [provider]  digoxin (LANOXIN) 0.125 MG tablet Take 0.125 mg by mouth daily.   Yes [provider]  FLUoxetine (PROZAC) 40 MG capsule Take 40 mg by mouth daily.   Yes [provider]  fluticasone (FLOVENT DISKUS) 50 MCG/BLIST diskus inhaler Inhale 2 puffs into the lungs 2 (two) times daily.   Yes [provider]  furosemide (LASIX) 20 MG tablet Take 20 mg by mouth daily.   Yes [provider]  levothyroxine (SYNTHROID) 88 MCG tablet Take 88 mcg by mouth daily before breakfast.   Yes [provider]  losartan (COZAAR) 50 MG tablet Take 50 mg by mouth daily.   Yes [provider]  magnesium hydroxide (MILK OF MAGNESIA) 400 MG/5ML suspension Take 30 mLs by mouth daily as needed for mild constipation.   Yes [provider]   omeprazole (PRILOSEC) 20 MG capsule Take 20 mg by mouth daily.   Yes [provider]  pravastatin (PRAVACHOL) 40 MG tablet Take 40 mg by mouth at bedtime.   Yes [provider]  simethicone (MYLICON) 125 MG chewable tablet Chew 125 mg by mouth 3 (three) times daily as needed for flatulence.   Yes [provider]  vitamin B-12 (CYANOCOBALAMIN) 100 MCG tablet Take 100 mcg by mouth daily.   Yes [provider]    Physical Exam: Vitals:   04/19/20 0730 04/19/20 0745 04/19/20 0802 04/19/20 0815  BP: (!) 151/82 (!) 146/86 (!) 142/63 Marland Kitchen(!)  143/59  Pulse: 70 (!) 56 76 75  Resp: 18 19 20 14   Temp:   98.2 F (36.8 C) 98.3 F (36.8 C)  TempSrc:   Oral Oral  SpO2: 99% 100% 100% 100%     . General:  Appears calm and comfortable and is NAD . Eyes:  PERRL, EOMI, normal lids, iris . ENT:  grossly normal hearing, lips & tongue, mmm; poor dentition . Neck:  no LAD, masses or thyromegaly . Cardiovascular:  Irregularly irregular, no m/r/g. 1+ LE edema.  Respiratory:   CTA bilaterally with no wheezes/rales/rhonchi.  Normal respiratory effort. . Abdomen:  soft, NT, ND, NABS . Skin:  no rash or induration seen on limited exam . Musculoskeletal:  grossly normal tone BUE/BLE, good ROM, no bony abnormality . Psychiatric:  grossly normal mood and affect, speech fluent and appropriate, AOx2, possible mild cognitive impairment appreciated on exam . Neurologic:  CN 2-12 grossly intact, moves all extremities in coordinated fashion    Radiological Exams on Admission: No results found.  EKG: Independently reviewed.  Afib with rate 59; nonspecific ST changes with no evidence of acute ischemia   Labs on Admission: I have personally reviewed the available labs and imaging studies at the time of the admission.  Pertinent labs:   Glucose 119 BUN 21/Creatinine 1.25/GFR 39 WBC 7.8 Hgb 7.2 INR 1.0 Heme positive Flu negative COVID  negative   Assessment/Plan Principal Problem:   Acute GI bleeding Active Problems:   Essential hypertension   Diverticulosis   CHF (congestive heart failure) (HCC)   CAD (coronary artery disease)   Atrial fibrillation (HCC)   Asthma   Dependence on nocturnal oxygen therapy   Dementia (HCC)    GI Bleeding with ABLA -Patient with 2 prior recent hospitalizations with reported EGD x 1 and colonoscopy x 3 showing diverticulosis; records have been requested -Most likely source is diverticular bleeding, although SB AVM is also a consideration -She was previously on Xarelto but this has been stopped; her last dose was over a week ago and possibly 2 weeks ago -She is afebrile at this time with tachycardia, and no leukocytosis; will not give antibiotics at this time.  -Will admit to med surg - given her multiple recent hospitalizations for this same issue and need for recurrent transfusions, it seems unlikely that she will not require several days of hospitalization for ongoing evaluation and management -Discharge Hgb was 9.5 and it is currently 7.2; she is receiving 2 units of PRBC now -CBC q12h; transfuse for Hgb <7 -Continue to monitor for recurrent bleeding -If significant bleeding, a tagged RBC scan could be considered to try to determine the most likely source of the bleeding ; however, she does not appear to have frank hematochezia and so this study is less likely to be useful -GI consult has been requested; Dr. Marland Kitchen will see the patient -Depending on the location of the diverticuli, surgical consult may also need to be considered  Afib -Patient with long-standing afib -She is on bisoprolol and digoxin for rate control, will continue and check Dig level -Xarelto has been on hold since about 9/11 -Her daughter-in-law works in EP at [employer] and would like for her to be consulted on regarding a Watchman procedure; this was deferred at the time of admission due to the more pressing GI  bleed and ABLA issue but may need to be revisited during the hospitalization  Asthma, on nocturnal O2 -Continue Albuterol -She has been reportedly taking both Symbicort and Flovent,  which appears to be redundant -Will order Dulera now (formulary substitution for Symbicort) -Continue nocturnal O2 (although it is not entirely clear why she needs this at this time)  Dementia -It is not clear how much this has been discussed with patient/family, although her daughter-in-law does acknowledge mild cognitive impairment -She is taking Prozac and this has been continued -Sundowning may be an issue while she is here, although it is not clear whether she has had prior behavioral difficulties during hospitalization  Hypothyroidism -Check TSH -Continue Synthroid at current dose for now  HTN -Continue bisoprolol -Hold Cozaar for now  Renal dysfunction -No prior records available so unknown baseline renal function -Will monitor and attempt to avoid nephrotoxic medications -Gentle IVF hydration while NPO -Hold Lasix and Cozaar for now  CAD -Hold ASA for now, likely need to resume soon if possible -Continue Pravachol -She has prior remote stent  CHF -Uncertain whether systolic or diastolic since no records are available -Takes ACE/BB/Lasix at home -Appears to be compensated at this time -Will give Lasix between units of blood  Other -Code status discussed and ongoing discussion is encouraged -She has been progressively weak following multiple recent hospitalizations and is likely to need SNF rehab at the time of d/c (family is in agreement with this plan) -Will need PT/OT consults depending on anticipated date of d/c    Note: This patient has been tested and is negative for the novel coronavirus COVID-19.  DVT prophylaxis:  SCDs Code Status:  Full - confirmed with patient/family; I have encouraged the family to continue to discuss this Family Communication: Daughter-in-law was present  throughout the encounter Disposition Plan:  The patient is from: home  Anticipated d/c is to: SNF for rehab  Anticipated d/c date will depend on clinical response to treatment, likely several days or longer  Patient is currently: acutely ill Consults called: GI; TOC team Admission status:  Admit - It is my clinical opinion that admission to INPATIENT is reasonable and necessary because of the expectation that this patient will require hospital care that crosses at least 2 midnights to treat this condition based on the medical complexity of the problems presented.  Given the aforementioned information, the predictability of an adverse outcome is felt to be significant.    Jonah Blue MD [facility]   How to contact the [facility] Attending or Consulting provider 7A - 7P or covering provider during after hours 7P -7A, for this patient?  1. Check the care team in [facility] and look for a) attending/consulting [facility] provider listed and b) the [facility] team listed 2. Log into [email or web address] and use [facility]'s universal password to access. If you do not have the password, please contact the hospital operator. 3. Locate the [facility] provider you are looking for under [facility] and page to a number that you can be directly reached. 4. If you still have difficulty reaching the provider, please page the [facility] (Director on Call) for the Hospitalists listed on amion for assistance.   04/19/2020, 8:23 AM

## 2020-04-20 ENCOUNTER — Inpatient Hospital Stay (HOSPITAL_COMMUNITY): Payer: Medicare Other | Admitting: Certified Registered Nurse Anesthetist

## 2020-04-20 ENCOUNTER — Encounter (HOSPITAL_COMMUNITY): Admission: EM | Disposition: E | Payer: Self-pay | Source: Home / Self Care | Attending: Internal Medicine

## 2020-04-20 HISTORY — PX: COLONOSCOPY WITH PROPOFOL: SHX5780

## 2020-04-20 HISTORY — PX: GIVENS CAPSULE STUDY: SHX5432

## 2020-04-20 HISTORY — PX: ENTEROSCOPY: SHX5533

## 2020-04-20 LAB — CBC
HCT: 31.8 % — ABNORMAL LOW (ref 36.0–46.0)
HCT: 31.3 % — ABNORMAL LOW (ref 36.0–46.0)
Hemoglobin: 10 g/dL — ABNORMAL LOW (ref 12.0–15.0)
Hemoglobin: 9.7 g/dL — ABNORMAL LOW (ref 12.0–15.0)
MCH: 28.2 pg (ref 26.0–34.0)
MCH: 27.9 pg (ref 26.0–34.0)
MCHC: 31.4 g/dL (ref 30.0–36.0)
MCHC: 31 g/dL (ref 30.0–36.0)
MCV: 89.6 fL (ref 80.0–100.0)
MCV: 89.9 fL (ref 80.0–100.0)
Platelets: 323 10*3/uL (ref 150–400)
Platelets: 325 K/uL (ref 150–400)
RBC: 3.55 MIL/uL — ABNORMAL LOW (ref 3.87–5.11)
RBC: 3.48 MIL/uL — ABNORMAL LOW (ref 3.87–5.11)
RDW: 15.9 % — ABNORMAL HIGH (ref 11.5–15.5)
RDW: 15.9 % — ABNORMAL HIGH (ref 11.5–15.5)
WBC: 6.7 10*3/uL (ref 4.0–10.5)
WBC: 8.4 K/uL (ref 4.0–10.5)
nRBC: 0 % (ref 0.0–0.2)
nRBC: 0 % (ref 0.0–0.2)

## 2020-04-20 LAB — BASIC METABOLIC PANEL WITH GFR
Anion gap: 7 (ref 5–15)
BUN: 13 mg/dL (ref 8–23)
CO2: 27 mmol/L (ref 22–32)
Calcium: 8.8 mg/dL — ABNORMAL LOW (ref 8.9–10.3)
Chloride: 105 mmol/L (ref 98–111)
Creatinine, Ser: 1.07 mg/dL — ABNORMAL HIGH (ref 0.44–1.00)
GFR calc Af Amer: 54 mL/min — ABNORMAL LOW
GFR calc non Af Amer: 47 mL/min — ABNORMAL LOW
Glucose, Bld: 89 mg/dL (ref 70–99)
Potassium: 4.2 mmol/L (ref 3.5–5.1)
Sodium: 139 mmol/L (ref 135–145)

## 2020-04-20 SURGERY — IMAGING PROCEDURE, GI TRACT, INTRALUMINAL, VIA CAPSULE
Anesthesia: LOCAL

## 2020-04-20 SURGERY — COLONOSCOPY WITH PROPOFOL
Anesthesia: Monitor Anesthesia Care

## 2020-04-20 MED ORDER — PROPOFOL 10 MG/ML IV BOLUS
INTRAVENOUS | Status: DC | PRN
  Administered 2020-04-20: 20 mg via INTRAVENOUS

## 2020-04-20 MED ORDER — DIGOXIN 125 MCG PO TABS
0.1250 mg | ORAL_TABLET | Freq: Every day | ORAL | Status: DC
  Filled 2020-04-20: qty 1

## 2020-04-20 MED ORDER — EPHEDRINE SULFATE 50 MG/ML IJ SOLN
INTRAMUSCULAR | Status: DC | PRN
  Administered 2020-04-20 (×2): 5 mg via INTRAVENOUS

## 2020-04-20 MED ORDER — PHENYLEPHRINE 40 MCG/ML (10ML) SYRINGE FOR IV PUSH (FOR BLOOD PRESSURE SUPPORT)
PREFILLED_SYRINGE | INTRAVENOUS | Status: DC | PRN
  Administered 2020-04-20 (×2): 80 ug via INTRAVENOUS

## 2020-04-20 MED ORDER — PROPOFOL 500 MG/50ML IV EMUL
INTRAVENOUS | Status: DC | PRN
  Administered 2020-04-20: 100 ug/kg/min via INTRAVENOUS

## 2020-04-20 SURGICAL SUPPLY — 22 items

## 2020-04-20 SURGICAL SUPPLY — 1 items: TOWEL COTTON PACK 4EA (MISCELLANEOUS) ×4 IMPLANT

## 2020-04-20 NOTE — Op Note (Signed)
[facility] Patient Name: Victoria Oconnell Procedure Date : 04/16/2020 MRN: [id number] Attending MD: Jeani Hawking , MD Date of Birth: [birth year] CSN: [id number] Age: 86 Admit Type: Inpatient Procedure:                Colonoscopy Indications:              Iron deficiency anemia Providers:                Jeani Hawking, MD, Vicki Mallet, RN, Harrington Challenger,                            Technician, Dairl Ponder, CRNA Referring MD:              Medicines:                Propofol per Anesthesia Complications:            No immediate complications. Estimated Blood Loss:     Estimated blood loss: none. Procedure:                Pre-Anesthesia Assessment:                           - Prior to the procedure, a History and Physical                            was performed, and patient medications and                            allergies were reviewed. The patient's tolerance of                            previous anesthesia was also reviewed. The risks                            and benefits of the procedure and the sedation                            options and risks were discussed with the patient.                            All questions were answered, and informed consent                            was obtained. Prior Anticoagulants: The patient has                            taken no previous anticoagulant or antiplatelet                            agents. ASA Grade Assessment: III - A patient with                            severe systemic disease. After reviewing the risks  and benefits, the patient was deemed in                            satisfactory condition to undergo the procedure.                           - Sedation was administered by an anesthesia                            professional. Deep sedation was attained.                           After obtaining informed consent, the colonoscope                            was passed under direct vision.  Throughout the                            procedure, the patient's blood pressure, pulse, and                            oxygen saturations were monitored continuously. The                            [id number]-PH190L ([id number]) Olympus ultra slim colonoscope                            was introduced through the anus and advanced to the                            the cecum, identified by appendiceal orifice and                            ileocecal valve. The colonoscopy was extremely                            difficult due to a tortuous colon. Successful                            completion of the procedure was aided by changing                            endoscopes and straightening and shortening the                            scope to obtain bowel loop reduction. The patient                            tolerated the procedure well. The quality of the                            bowel preparation was good. The ileocecal valve,  appendiceal orifice, and rectum were photographed. Scope In: 8:05:35 AM Scope Out: 8:43:19 AM Scope Withdrawal Time: 0 hours 6 minutes 21 seconds  Total Procedure Duration: 0 hours 37 minutes 44 seconds  Findings:      Scattered medium-mouthed diverticula were found in the sigmoid colon.       The sigmoid colon was sharply angulated starting around 20 cm. It was       extremely difficult to navigate through this area. The pediatric       colonoscope was exchanged for an ultraslim colonoscope. Using a column       of water, gentle tip deflection, and slowly raking through the       angulation, the colonoscope was able to be advanced through the area       safely. Proximally, in the cecum and ascending colon, barotrauma from       the procedure was identified. Impression:               - Diverticulosis in the sigmoid colon.                           - No specimens collected. Recommendation:           - VCE today. Procedure Code(s):        ---  Professional ---                           [phone], Colonoscopy, flexible; diagnostic, including                            collection of specimen(s) by brushing or washing,                            when performed (separate procedure) Diagnosis Code(s):        --- Professional ---                           D50.9, Iron deficiency anemia, unspecified                           K57.30, Diverticulosis of large intestine without                            perforation or abscess without bleeding CPT copyright 2019 [employer] Association. All rights reserved. The codes documented in this report are preliminary and upon coder review may  be revised to meet current compliance requirements. Jeani Hawking, MD Jeani Hawking, MD 04/17/2020 9:51:04 AM This report has been signed electronically. Number of Addenda: 0

## 2020-04-20 NOTE — Progress Notes (Signed)
Progress Note    Victoria Oconnell  ZOX:[id number] DOB: 11/25/1929  DOA: 12-03-2019 PCP: Alben DeedsJinich, David, MD    Brief Narrative:     Medical records reviewed and are as summarized below:  Victoria Oconnell is an 86 y.o. female with medical history significant of afib, previously on Xarelto multiple recent hospitalizations for GI bleeds presenting with the same; Eliquis was stopped 1 week ago for this reason.  She has been having bloody stools.  She has had 2 recent hospital admissions in [place] - 3 weeks ago with scope, on Xarelto for afib, no source identified; she began bleeding again at home and so readmitted and Hgb was 4.9.  She had been inadvertently taking Xarelto.  Scoped twice, found a polyp and diverticulosis.  Were considering a Watchman due to afib and considering a laparoscopic procedure.  Her DIL works for EP here and so they were working on that.  She was discharged on Saturday of last weekend and followed up with PCP.  Hgb was 9.5 at d/c and above 8 Tuesday at PCP f/u.  Last night, she felt weak, tired, BP was 93/62.  She felt felt lightheaded.  She missed her wheelchair during transfer and so they decided to bring her in to [facility].  Initially had EGD and then had 3 subsequent colonoscopies.  She is off the Xarelto; last dose was almost 2 weeks ago.   Assessment/Plan:   Principal Problem:   Acute GI bleeding Active Problems:   Essential hypertension   Diverticulosis   CHF (congestive heart failure) (HCC)   CAD (coronary artery disease)   Atrial fibrillation (HCC)   Asthma   Dependence on nocturnal oxygen therapy   Dementia (HCC)   Anemia due to blood loss   GI Bleeding with ABLA -Patient with 2 prior recent hospitalizations with reported EGD x 1 and colonoscopy x 3 showing diverticulosis -She was previously on Xarelto but this has been stopped; her last dose was over a week ago and possibly 2 weeks ago -s/p 2 units PRBC -Continue to monitor for recurrent bleeding -GI  consult appreciated: s/p colonoscopy:  Diverticulosis in the sigmoid colon, no source of bleeding found.  Now getting capsule endoscopy  Afib -Patient with long-standing afib -reported to be bradycardic- -will place on tele -Xarelto has been on hold since about 9/11 -hold dig and bisop  Asthma, on nocturnal O2 -Continue Albuterol -She has been reportedly taking both Symbicort and Flovent, which appears to be redundant -Will order Dulera now (formulary substitution for Symbicort) -Continue nocturnal O2 (although it is not entirely clear why she needs this at this time)  Dementia -It is not clear how much this has been discussed with patient/family, although her daughter-in-law does acknowledge mild cognitive impairment -She is taking Prozac and this has been continued -Sundowning may be an issue while she is here, although it is not clear whether she has had prior behavioral difficulties during hospitalization  Hypothyroidism -Continue Synthroid at current dose for now -outpatient follow up  HTN -Hold Cozaar for now  Renal dysfunction --monitor  CAD -Hold ASA for now, likely need to resume soon if possible -Continue Pravachol -She has prior remote stent  CHF -Uncertain whether systolic or diastolic since no records are available -Takes ACE/BB/Lasix at home -Appears to be compensated at this time -resume lasix as able     Family Communication/Anticipated D/C date and plan/Code Status   DVT prophylaxis: scd Code Status: Full Code.  Disposition Plan: Status is: Inpatient  Remains  inpatient appropriate because:Inpatient level of care appropriate due to severity of illness   Dispo: The patient is from: Home              Anticipated d/c is to: SNF              Anticipated d/c date is: 2 days              Patient currently is not medically stable to d/c.         Medical Consultants:   GI    Subjective:   Just back from capsule  endoscopy  Objective:    Vitals:   04/19/2020 1042 04/19/2020 1052 04/15/2020 1145 03/27/2020 1244  BP: (!) 161/81 (!) 161/89 (!) 165/79 139/73  Pulse: (!) 57  60 60  Resp:    16  Temp:    98 F (36.7 C)  TempSrc:    Oral  SpO2: 96%  95% 97%  Weight:      Height:        Intake/Output Summary (Last 24 hours) at 04/10/2020 1345 Last data filed at 04/19/2020 0849 Gross per 24 hour  Intake 1682.5 ml  Output --  Net 1682.5 ml   Filed Weights   03/31/2020 0709  Weight: 61.2 kg    Exam:  General: Appearance:    Well developed, well nourished female in no acute distress     Lungs:      respirations unlabored  Heart:    Normal heart rate. irregular-- around 60  No murmurs, rubs, or gallops.   MS:   All extremities are intact.   Neurologic:   Awake, alert, pleasant and cooperative    Data Reviewed:   I have personally reviewed following labs and imaging studies:  Labs: Labs show the following:   Basic Metabolic Panel: Recent Labs  Lab 04/19/20 0000 04/17/2020 0633  NA 132* 139  K 4.7 4.2  CL 95* 105  CO2 27 27  GLUCOSE 119* 89  BUN 21 13  CREATININE 1.25* 1.07*  CALCIUM 9.1 8.8*   GFR Estimated Creatinine Clearance: 33.3 mL/min (A) (by C-G formula based on SCr of 1.07 mg/dL (H)). Liver Function Tests: No results for input(s): AST, ALT, ALKPHOS, BILITOT, PROT, ALBUMIN in the last 168 hours. No results for input(s): LIPASE, AMYLASE in the last 168 hours. No results for input(s): AMMONIA in the last 168 hours. Coagulation profile Recent Labs  Lab 04/19/20 0000  INR 1.0    CBC: Recent Labs  Lab 04/19/20 0000 04/19/20 0455 04/19/20 1649 03/28/2020 0633  WBC 7.8  --  6.9 6.7  HGB 7.2* 6.8* 9.9* 10.0*  HCT 23.2* 22.7* 30.6* 31.8*  MCV 95.9  --  89.5 89.6  PLT 413*  --  336 323   Cardiac Enzymes: No results for input(s): CKTOTAL, CKMB, CKMBINDEX, TROPONINI in the last 168 hours. BNP (last 3 results) No results for input(s): PROBNP in the last 8760  hours. CBG: No results for input(s): GLUCAP in the last 168 hours. D-Dimer: No results for input(s): DDIMER in the last 72 hours. Hgb A1c: No results for input(s): HGBA1C in the last 72 hours. Lipid Profile: No results for input(s): CHOL, HDL, LDLCALC, TRIG, CHOLHDL, LDLDIRECT in the last 72 hours. Thyroid function studies: Recent Labs    04/19/20 1649  TSH 6.325*   Anemia work up: No results for input(s): VITAMINB12, FOLATE, FERRITIN, TIBC, IRON, RETICCTPCT in the last 72 hours. Sepsis Labs: Recent Labs  Lab 04/19/20 0000 04/19/20 1649 04/03/2020  6389  WBC 7.8 6.9 6.7    Microbiology Recent Results (from the past 240 hour(s))  Respiratory Panel by RT PCR (Flu A&B, Covid) - Nasopharyngeal Swab     Status: None   Collection Time: 04/19/20  6:33 AM   Specimen: Nasopharyngeal Swab  Result Value Ref Range Status   SARS Coronavirus 2 by RT PCR NEGATIVE NEGATIVE Final    Comment: (NOTE) SARS-CoV-2 target nucleic acids are NOT DETECTED.  The SARS-CoV-2 RNA is generally detectable in upper respiratoy specimens during the acute phase of infection. The lowest concentration of SARS-CoV-2 viral copies this assay can detect is 131 copies/mL. A negative result does not preclude SARS-Cov-2 infection and should not be used as the sole basis for treatment or other patient management decisions. A negative result may occur with  improper specimen collection/handling, submission of specimen other than nasopharyngeal swab, presence of viral mutation(s) within the areas targeted by this assay, and inadequate number of viral copies (<131 copies/mL). A negative result must be combined with clinical observations, patient history, and epidemiological information. The expected result is Negative.  Fact Sheet for Patients:  https://www.moore.com/  Fact Sheet for Healthcare Providers:  https://www.young.biz/  This test is no t yet approved or cleared by  the [place] FDA and  has been authorized for detection and/or diagnosis of SARS-CoV-2 by FDA under an Emergency Use Authorization (EUA). This EUA will remain  in effect (meaning this test can be used) for the duration of the COVID-19 declaration under Section 564(b)(1) of the Act, 21 U.S.C. section 360bbb-3(b)(1), unless the authorization is terminated or revoked sooner.     Influenza A by PCR NEGATIVE NEGATIVE Final   Influenza B by PCR NEGATIVE NEGATIVE Final    Comment: (NOTE) The Xpert Xpress SARS-CoV-2/FLU/RSV assay is intended as an aid in  the diagnosis of influenza from Nasopharyngeal swab specimens and  should not be used as a sole basis for treatment. Nasal washings and  aspirates are unacceptable for Xpert Xpress SARS-CoV-2/FLU/RSV  testing.  Fact Sheet for Patients: https://www.moore.com/  Fact Sheet for Healthcare Providers: https://www.young.biz/  This test is not yet approved or cleared by the [place] FDA and  has been authorized for detection and/or diagnosis of SARS-CoV-2 by  FDA under an Emergency Use Authorization (EUA). This EUA will remain  in effect (meaning this test can be used) for the duration of the  Covid-19 declaration under Section 564(b)(1) of the Act, 21  U.S.C. section 360bbb-3(b)(1), unless the authorization is  terminated or revoked. Performed at [facility], [address], [place], [place] [address]     Procedures and diagnostic studies:  No results found.  Medications:   . [START ON 04/21/2020] digoxin  0.125 mg Oral Daily  . FLUoxetine  40 mg Oral Daily  . levothyroxine  88 mcg Oral QAC breakfast  . mometasone-formoterol  2 puff Inhalation BID  . pantoprazole (PROTONIX) IV  40 mg Intravenous Q24H  . pravastatin  40 mg Oral QHS   Continuous Infusions: . lactated ringers 75 mL/hr at 05-04-20 0740     LOS: 1 day   Joseph Art  [facility]   How to contact  the [facility] Attending or Consulting provider 7A - 7P or covering provider during after hours 7P -7A, for this patient?  1. Check the care team in [facility] and look for a) attending/consulting TR[facility] provider listed and b) the [facility] team listed 2. Log into [email or web address] and use [facility]'s universal password to  access. If you do not have the password, please contact the hospital operator. 3. Locate the [facility] provider you are looking for under [facility] and page to a number that you can be directly reached. 4. If you still have difficulty reaching the provider, please page the [facility] (Director on Call) for the Hospitalists listed on amion for assistance.  2020/04/26, 1:45 PM

## 2020-04-20 NOTE — Anesthesia Postprocedure Evaluation (Signed)
Anesthesia Post Note  Patient: Victoria Oconnell  Procedure(s) Performed: COLONOSCOPY WITH PROPOFOL (N/A ) ENTEROSCOPY (N/A )     Patient location during evaluation: PACU Anesthesia Type: MAC Level of consciousness: awake and alert Pain management: pain level controlled Vital Signs Assessment: post-procedure vital signs reviewed and stable Respiratory status: spontaneous breathing, nonlabored ventilation, respiratory function stable and patient connected to nasal cannula oxygen Cardiovascular status: stable and blood pressure returned to baseline Postop Assessment: no apparent nausea or vomiting Anesthetic complications: no   No complications documented.  Last Vitals:  Vitals:   04/19/2020 1052 04/03/2020 1145  BP: (!) 161/89 (!) 165/79  Pulse:  60  Resp:    Temp:    SpO2:  95%    Last Pain:  Vitals:   04/09/2020 1000  TempSrc: Oral  PainSc:                  Feven Alderfer DAVID

## 2020-04-20 NOTE — Anesthesia Preprocedure Evaluation (Signed)
Anesthesia Evaluation  Patient identified by MRN, date of birth, ID band Patient awake    Reviewed: Allergy & Precautions, NPO status , Patient's Chart, lab work & pertinent test results  Airway Mallampati: I  TM Distance: >3 FB Neck ROM: Full    Dental   Pulmonary    Pulmonary exam normal        Cardiovascular hypertension, + CAD and + Cardiac Stents  Normal cardiovascular exam+ dysrhythmias Atrial Fibrillation      Neuro/Psych Dementia    GI/Hepatic   Endo/Other    Renal/GU      Musculoskeletal   Abdominal   Peds  Hematology   Anesthesia Other Findings   Reproductive/Obstetrics                             Anesthesia Physical Anesthesia Plan  ASA: III  Anesthesia Plan: MAC   Post-op Pain Management:    Induction: Intravenous  PONV Risk Score and Plan: 2 and Treatment may vary due to age or medical condition  Airway Management Planned: Nasal Cannula  Additional Equipment:   Intra-op Plan:   Post-operative Plan:   Informed Consent: I have reviewed the patients History and Physical, chart, labs and discussed the procedure including the risks, benefits and alternatives for the proposed anesthesia with the patient or authorized representative who has indicated his/her understanding and acceptance.       Plan Discussed with: CRNA and Surgeon  Anesthesia Plan Comments:         Anesthesia Quick Evaluation

## 2020-04-20 NOTE — Transfer of Care (Signed)
Immediate Anesthesia Transfer of Care Note  Patient: Victoria Oconnell  Procedure(s) Performed: COLONOSCOPY WITH PROPOFOL (N/A ) ENTEROSCOPY (N/A )  Patient Location: PACU  Anesthesia Type:MAC  Level of Consciousness: sedated  Airway & Oxygen Therapy: Patient Spontanous Breathing and Patient connected to nasal cannula oxygen  Post-op Assessment: Report given to RN and Post -op Vital signs reviewed and stable  Post vital signs: Reviewed and stable  Last Vitals:  Vitals Value Taken Time  BP    Temp    Pulse    Resp    SpO2      Last Pain:  Vitals:   04/01/2020 0709  TempSrc: Oral  PainSc: 0-No pain         Complications: No complications documented.

## 2020-04-20 NOTE — Op Note (Signed)
[facility] Patient Name: Victoria Oconnell Procedure Date : 04/21/2020 MRN: [id number] Attending MD: Jeani Hawking , MD Date of Birth: 1930-06-13 CSN: [id number] Age: 86 Admit Type: Inpatient Procedure:                Small bowel enteroscopy Indications:              Iron deficiency anemia Providers:                Jeani Hawking, MD, Vicki Mallet, RN, Harrington Challenger,                            Technician, Dairl Ponder, CRNA Referring MD:              Medicines:                Propofol per Anesthesia Complications:            No immediate complications. Estimated Blood Loss:     Estimated blood loss: none. Procedure:                Pre-Anesthesia Assessment:                           - Prior to the procedure, a History and Physical                            was performed, and patient medications and                            allergies were reviewed. The patient's tolerance of                            previous anesthesia was also reviewed. The risks                            and benefits of the procedure and the sedation                            options and risks were discussed with the patient.                            All questions were answered, and informed consent                            was obtained. Prior Anticoagulants: The patient has                            taken no previous anticoagulant or antiplatelet                            agents. ASA Grade Assessment: III - A patient with                            severe systemic disease. After reviewing the risks  and benefits, the patient was deemed in                            satisfactory condition to undergo the procedure.                           - Sedation was administered by an anesthesia                            professional. Deep sedation was attained.                           After obtaining informed consent, the endoscope was                            passed under  direct vision. Throughout the                            procedure, the patient's blood pressure, pulse, and                            oxygen saturations were monitored continuously. The                            [id number]-[id number] ([id number] Olympus pediatric colonoscope                            was introduced through the mouth and advanced to                            the small bowel distal to the Ligament of Treitz.                            The small bowel enteroscopy was accomplished                            without difficulty. The patient tolerated the                            procedure well. Scope In: Scope Out: Findings:      The esophagus was normal.      The stomach was normal.      The examined duodenum was normal.      There was no evidence of significant pathology in the entire examined       portion of jejunum. Two deep passes into the jejunum were performed.      There was a small patch of erythematous mucosa in the proximal stomach.       The significance is uncertain, but it is not the source of her anemia. Impression:               - Normal esophagus.                           - Normal stomach.                           -  Normal examined duodenum.                           - The examined portion of the jejunum was normal.                           - No specimens collected. Recommendation:           - Proceed with the colonoscopy. Procedure Code(s):        --- Professional ---                           [phone], Small intestinal endoscopy, enteroscopy                            beyond second portion of duodenum, not including                            ileum; diagnostic, including collection of                            specimen(s) by brushing or washing, when performed                            (separate procedure) Diagnosis Code(s):        --- Professional ---                           D50.9, Iron deficiency anemia, unspecified CPT copyright 2019 American Medical  Association. All rights reserved. The codes documented in this report are preliminary and upon coder review may  be revised to meet current compliance requirements. Jeani Hawking, MD Jeani Hawking, MD 04/27/20 9:53:48 AM This report has been signed electronically. Number of Addenda: 0

## 2020-04-20 NOTE — Interval H&P Note (Signed)
History and Physical Interval Note:  06-May-2020 7:41 AM  Victoria Oconnell  has presented today for surgery, with the diagnosis of Gi bleeding.  The various methods of treatment have been discussed with the patient and family. After consideration of risks, benefits and other options for treatment, the patient has consented to  Procedure(s): COLONOSCOPY WITH PROPOFOL (N/A) ENTEROSCOPY (N/A) as a surgical intervention.  The patient's history has been reviewed, patient examined, no change in status, stable for surgery.  I have reviewed the patient's chart and labs.  Questions were answered to the patient's satisfaction.     Sherard Sutch D

## 2020-04-21 ENCOUNTER — Inpatient Hospital Stay (HOSPITAL_COMMUNITY): Payer: Medicare Other

## 2020-04-21 DIAGNOSIS — I639 Cerebral infarction, unspecified: Secondary | ICD-10-CM

## 2020-04-21 LAB — BASIC METABOLIC PANEL
Anion gap: 6 (ref 5–15)
BUN: 10 mg/dL (ref 8–23)
CO2: 25 mmol/L (ref 22–32)
Calcium: 8.4 mg/dL — ABNORMAL LOW (ref 8.9–10.3)
Chloride: 102 mmol/L (ref 98–111)
Creatinine, Ser: 0.95 mg/dL (ref 0.44–1.00)
GFR calc Af Amer: >60 mL/min (ref >60)
GFR calc non Af Amer: 54 mL/min — ABNORMAL LOW (ref >60)
Glucose, Bld: 95 mg/dL (ref 70–99)
Potassium: 3.8 mmol/L (ref 3.5–5.1)
Sodium: 133 mmol/L — ABNORMAL LOW (ref 135–145)

## 2020-04-21 LAB — CBC
HCT: 28.4 % — ABNORMAL LOW (ref 36.0–46.0)
Hemoglobin: 9 g/dL — ABNORMAL LOW (ref 12.0–15.0)
MCH: 28.8 pg (ref 26.0–34.0)
MCHC: 31.7 g/dL (ref 30.0–36.0)
MCV: 91 fL (ref 80.0–100.0)
Platelets: 346 10*3/uL (ref 150–400)
RBC: 3.12 MIL/uL — ABNORMAL LOW (ref 3.87–5.11)
RDW: 15.9 % — ABNORMAL HIGH (ref 11.5–15.5)
WBC: 7.5 10*3/uL (ref 4.0–10.5)
nRBC: 0 % (ref 0.0–0.2)

## 2020-04-21 LAB — LIPID PANEL
Cholesterol: 105 mg/dL (ref 0–200)
HDL: 39 mg/dL — ABNORMAL LOW (ref >40)
LDL Cholesterol: 54 mg/dL (ref 0–99)
Total CHOL/HDL Ratio: 2.7 RATIO
Triglycerides: 58 mg/dL (ref <150)
VLDL: 12 mg/dL (ref 0–40)

## 2020-04-21 LAB — URINALYSIS, ROUTINE W REFLEX MICROSCOPIC
Bilirubin Urine: NEGATIVE
Glucose, UA: NEGATIVE mg/dL
Hgb urine dipstick: NEGATIVE
Ketones, ur: NEGATIVE mg/dL
Leukocytes,Ua: NEGATIVE
Nitrite: NEGATIVE
Protein, ur: NEGATIVE mg/dL
Specific Gravity, Urine: 1.013 (ref 1.005–1.030)
pH: 7 (ref 5.0–8.0)

## 2020-04-21 LAB — GLUCOSE, CAPILLARY: Glucose-Capillary: 100 mg/dL — ABNORMAL HIGH (ref 70–99)

## 2020-04-21 LAB — HEMOGLOBIN A1C
Hgb A1c MFr Bld: 5.3 % (ref 4.8–5.6)
Mean Plasma Glucose: 105.41 mg/dL

## 2020-04-21 IMAGING — CT CT HEAD CODE STROKE
3 of 4 series · 15 of 47 positions shown, 18 images · non-contrast
Comparison: None.

CLINICAL DATA: Code stroke. Altered mental status and speech
difficulty

EXAM:
CT HEAD WITHOUT CONTRAST
TECHNIQUE: Contiguous axial images were obtained from the base of the skull
through the vertex without intravenous contrast.

[Series 3: head 5.0 h30s · axial · 0.43mm/px · z∈[-115,+10]mm · 9 of 31 slices shown, 12 images]
[im 3/31  brain]
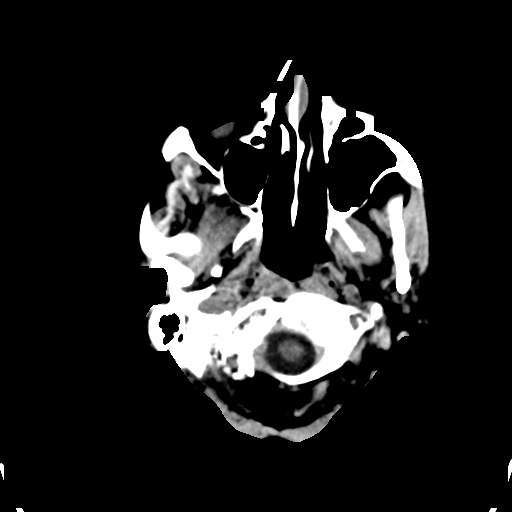
[im 3/31  bone]
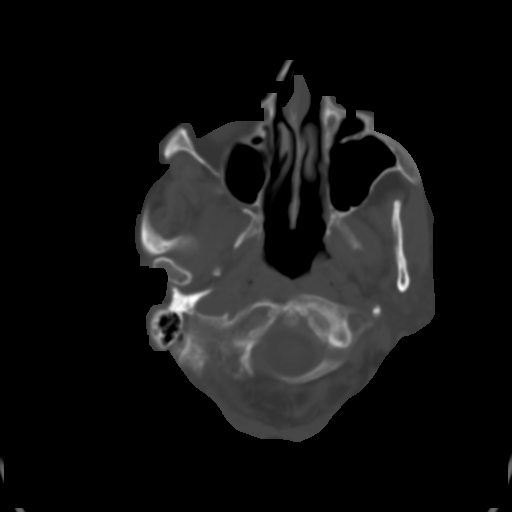
[im 7/31  brain]
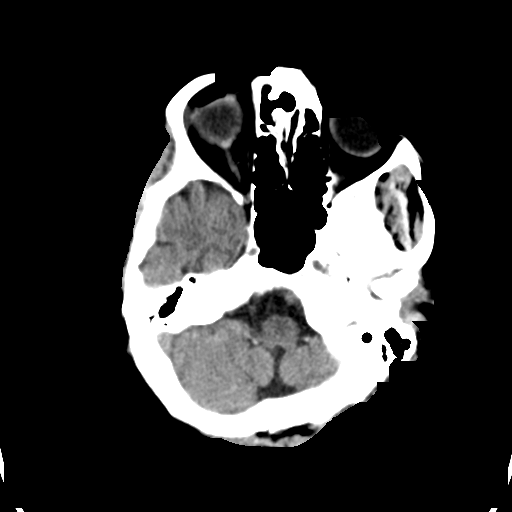
[im 9/31  brain]
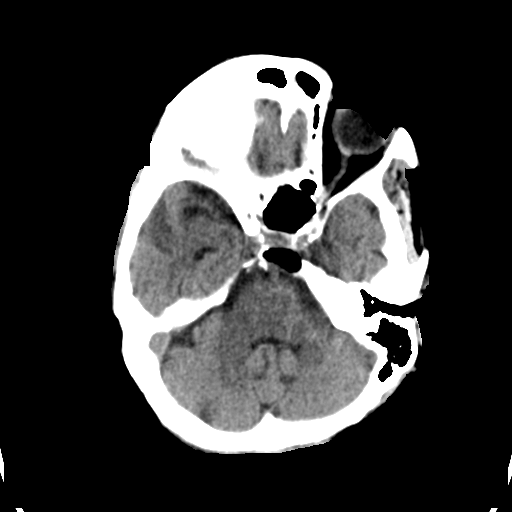
[im 13/31  brain]
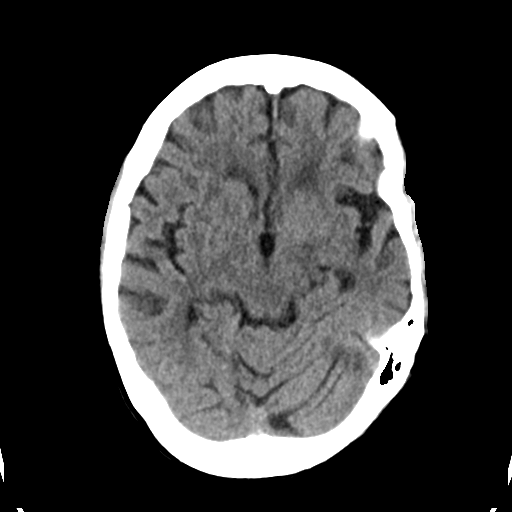
[im 16/31  brain]
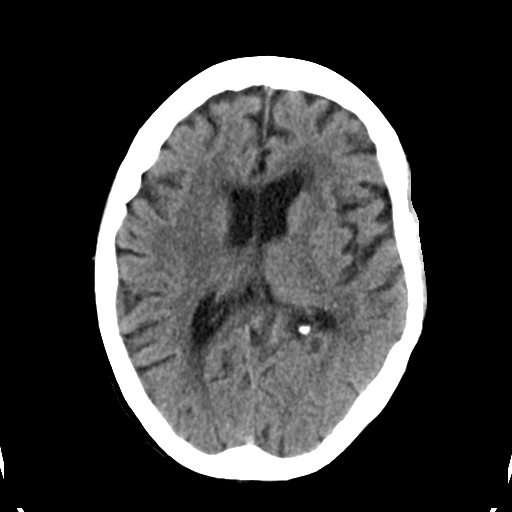
[im 16/31  bone]
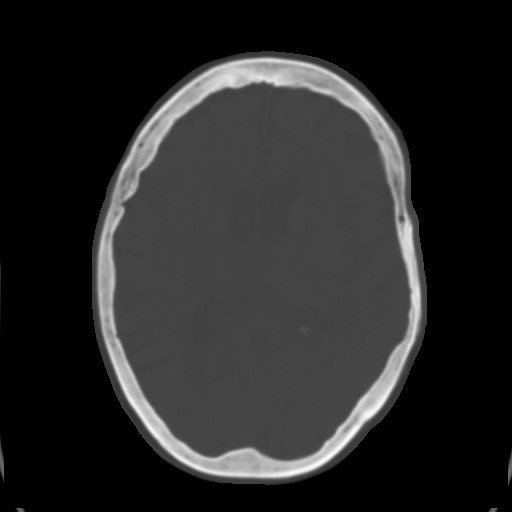
[im 18/31  brain]
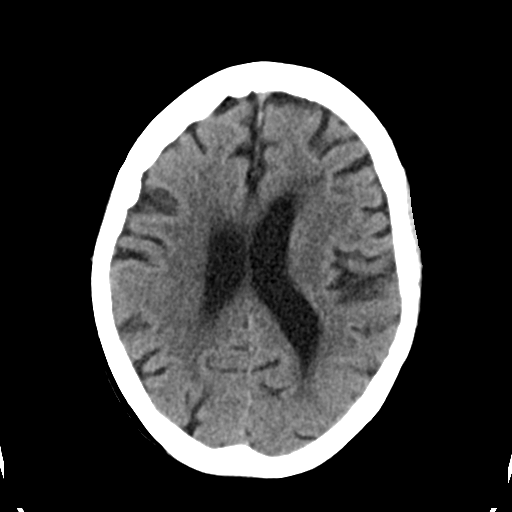
[im 22/31  brain]
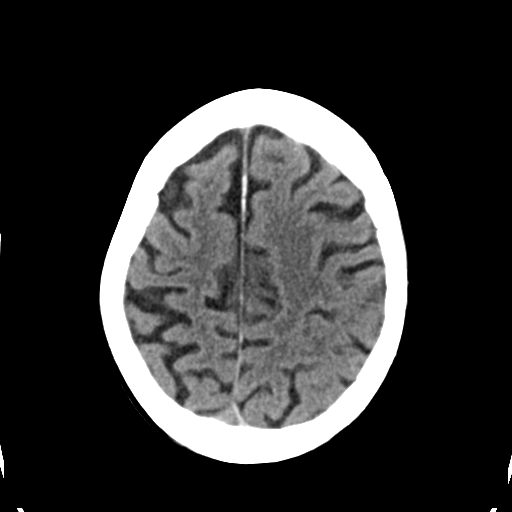
[im 24/31  brain]
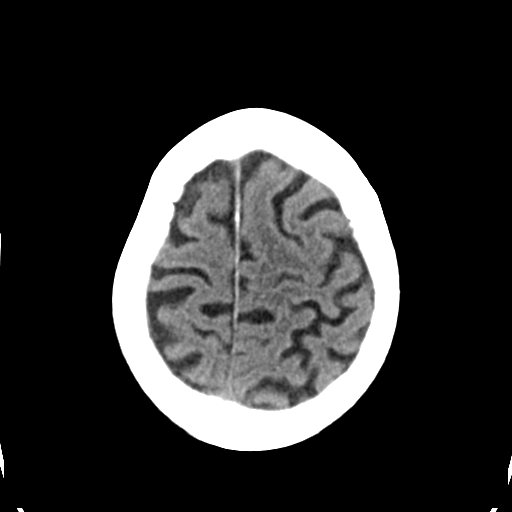
[im 28/31  brain]
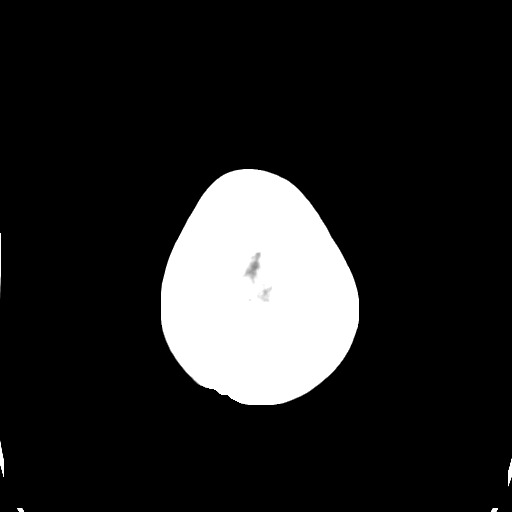
[im 28/31  bone]
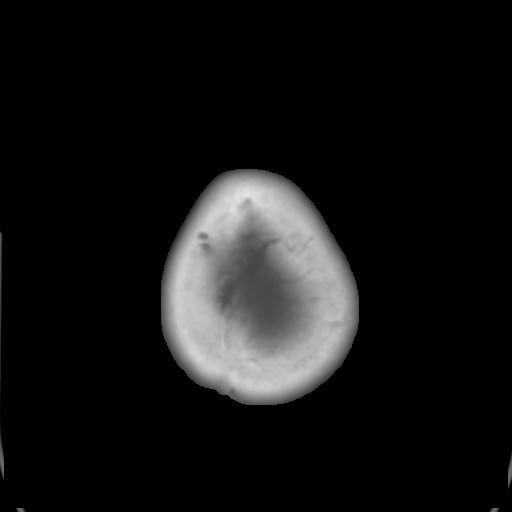

[Series 5: head 3.0 mpr cor · coronal · 0.30mm/px · 3 of 67 slices shown]
[im 23/67  brain]
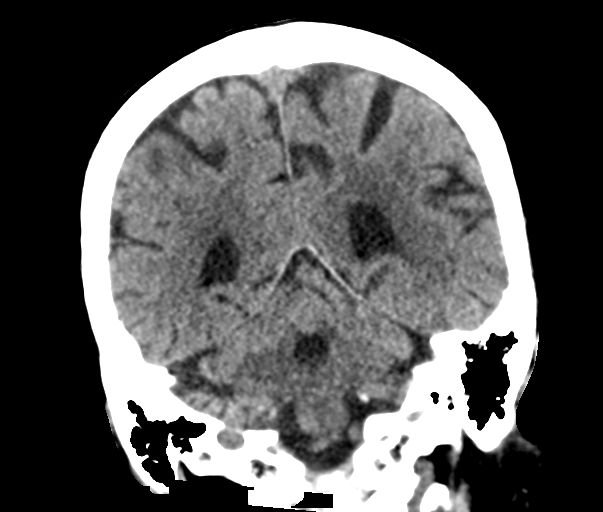
[im 30/67  brain]
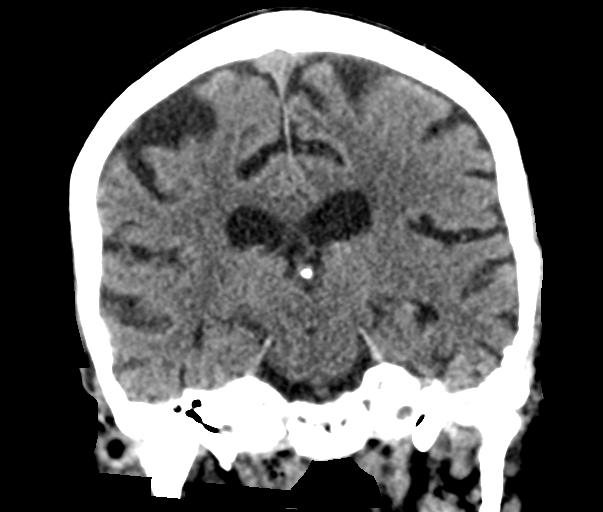
[im 37/67  brain]
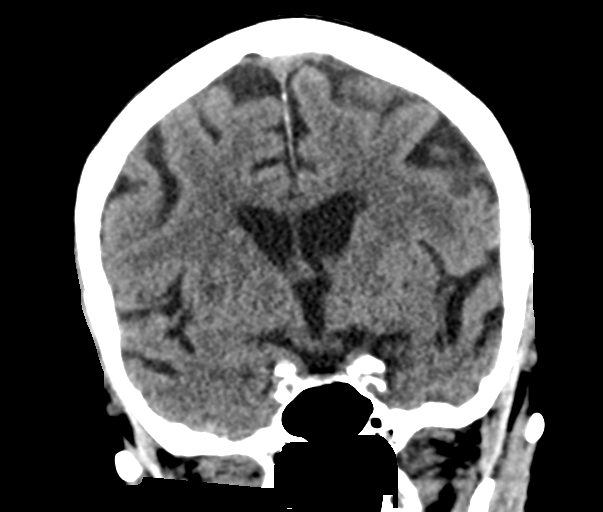

[Series 6: head 3.0 mpr sag · sagittal · 0.30mm/px · 3 of 58 slices shown]
[im 22/58  brain]
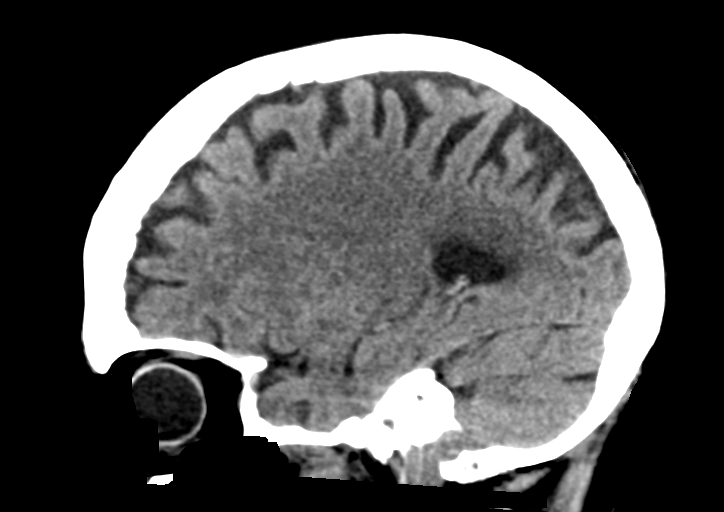
[im 29/58  brain]
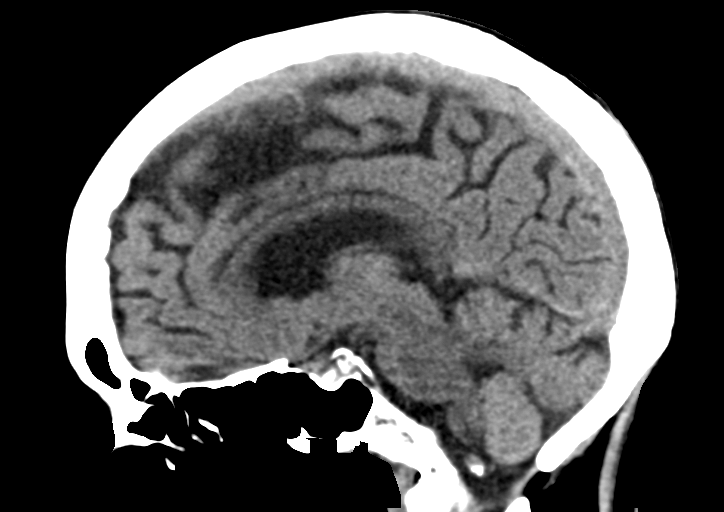
[im 35/58  brain]
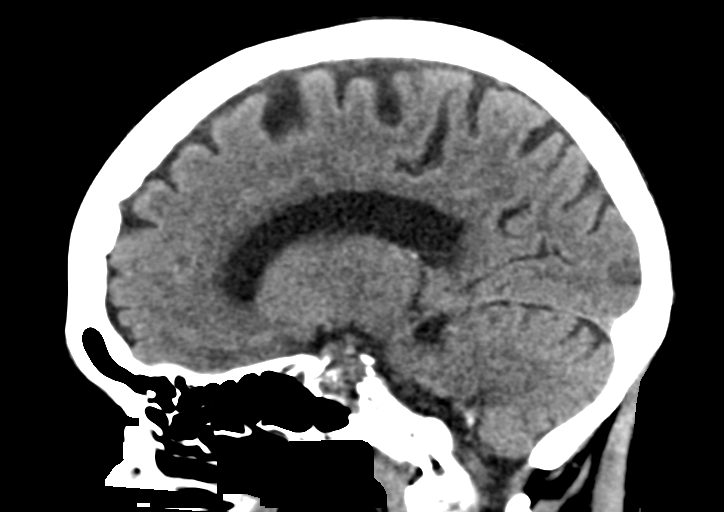

[15 of 47 positions shown; findings below may reference images not displayed]

FINDINGS: Brain: No evidence of acute infarction, hemorrhage, hydrocephalus,
extra-axial collection or mass lesion/mass effect. Cerebral volume
loss and mild chronic white matter disease for age.

Vascular: No hyperdense vessel or unexpected calcification.

Skull: Normal. Negative for fracture or focal lesion.

Sinuses/Orbits: No acute finding.

Other: These results were communicated to Dr. BILLIOT at [DATE] pmon
[DATE]by text page via the AMION messaging system.

ASPECTS (Alberta Stroke Program Early CT Score)

- Ganglionic level infarction (caudate, lentiform nuclei, internal
capsule, insula, M1-M3 cortex): 7

- Supraganglionic infarction (M4-M6 cortex): 3

Total score (0-10 with 10 being normal): 10
IMPRESSION: No acute finding.ASPECTS is 10.

## 2020-04-21 IMAGING — MR MR HEAD W/O CM
11 of 12 series · 43 of 48 positions shown · non-contrast
Comparison: [DATE] head CT.
COMPARISON: [DATE] head CT.

Addendum:
CLINICAL DATA: Neuro deficit

EXAM:
MRI HEAD WITHOUT CONTRAST
MRA HEAD WITHOUT CONTRAST
TECHNIQUE: Multiplanar, multiecho pulse sequences of the brain and surrounding
structures were obtained without intravenous contrast. Angiographic
images of the head were obtained using MRA technique without
contrast.

[Series 5: DWI · axial · 3.0mm · 0.88mm/px · z∈[-66,+73]mm · 9 of 96 slices shown (1 of 4)]
[im 1/96]
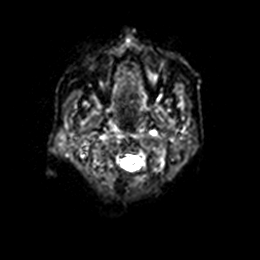
[im 12/96]
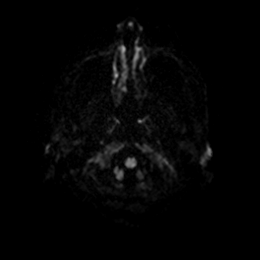
[im 24/96]
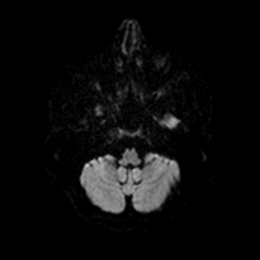
[im 36/96]
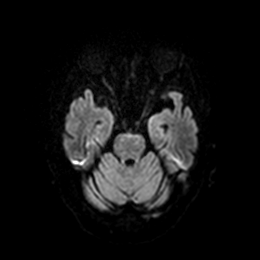
[im 48/96]
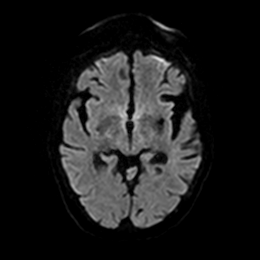
[im 60/96]
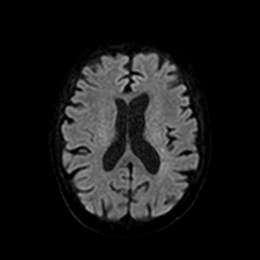
[im 72/96]
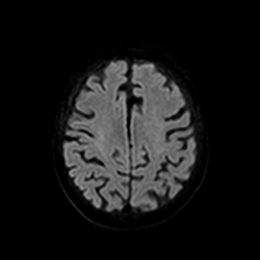
[im 84/96]
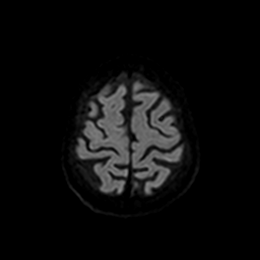
[im 96/96]
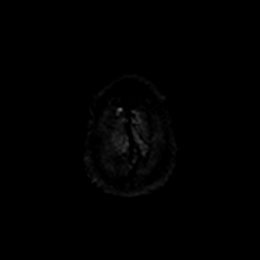

[Series 6: DWI · axial · 3.0mm · 0.88mm/px · z∈[-66,+73]mm · 4 of 48 slices shown (2 of 4)]
[im 1/48]
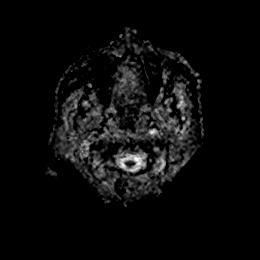
[im 16/48]
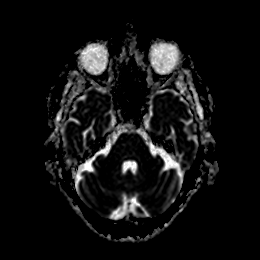
[im 32/48]
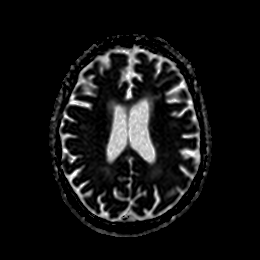
[im 48/48]
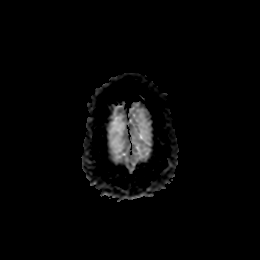

[Series 7: DWI · coronal · 4.0mm · 0.88mm/px · 5 of 64 slices shown (3 of 4)]
[im 1/64]
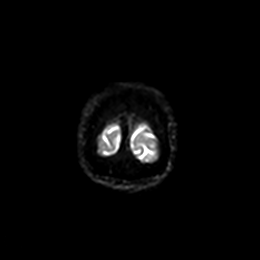
[im 16/64]
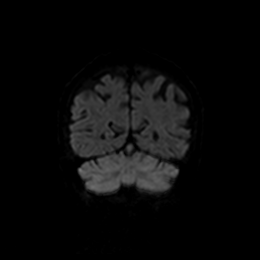
[im 32/64]
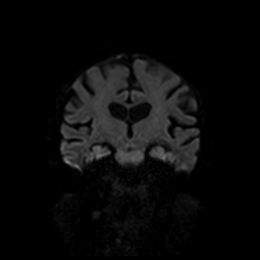
[im 48/64]
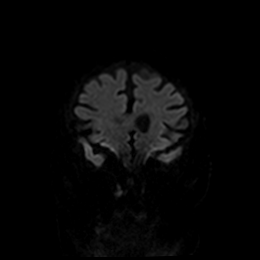
[im 64/64]
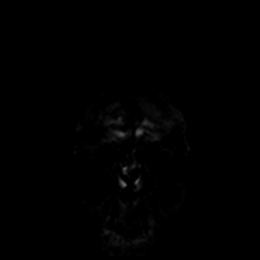

[Series 8: DWI · coronal · 4.0mm · 0.88mm/px · 3 of 31 slices shown (4 of 4)]
[im 1/31]
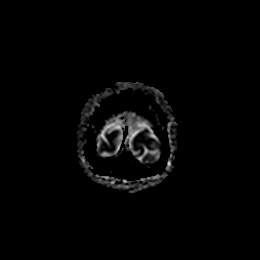
[im 16/31]
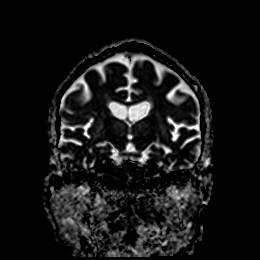
[im 31/31]
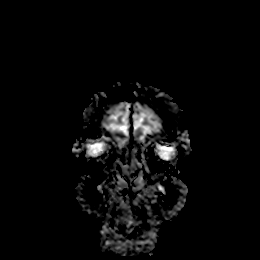

[Series 9: T1 · sagittal · 5.0mm · 0.75mm/px · 2 of 23 slices shown]
[im 1/23]
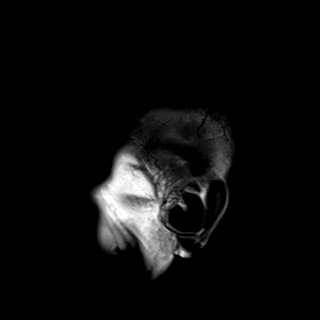
[im 23/23]
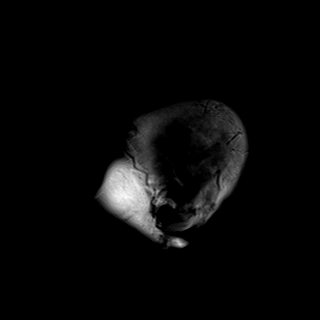

[Series 10: T2 · axial · 5.0mm · 0.72mm/px · z∈[-73,+70]mm · 2 of 25 slices shown (1 of 2)]
[im 1/25]
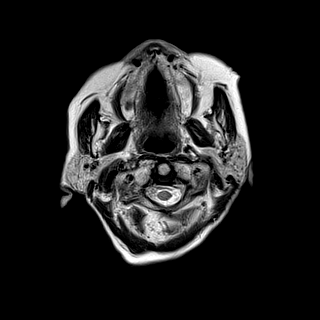
[im 25/25]
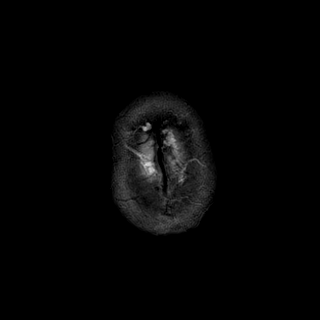

[Series 11: FLAIR · axial · 5.0mm · 0.45mm/px · z∈[-70,+72]mm · 2 of 25 slices shown]
[im 1/25]
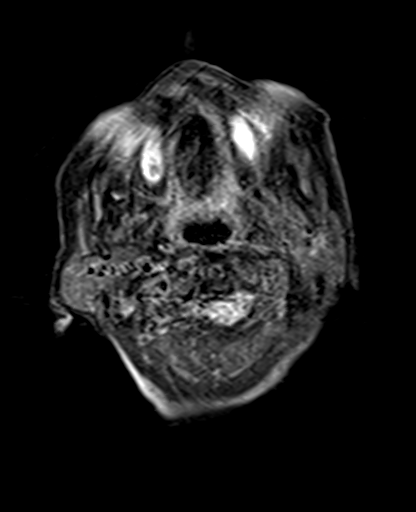
[im 25/25]
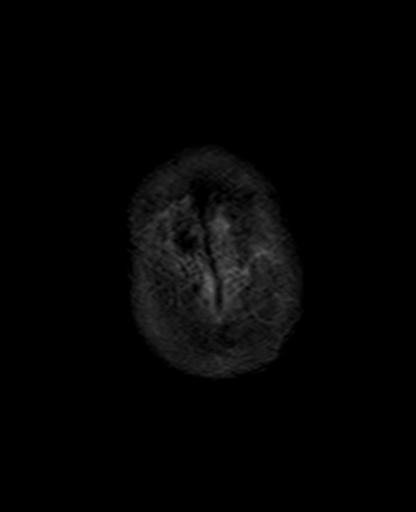

[Series 12: mag_images · axial · 3.0mm · 0.90mm/px · z∈[-87,+89]mm · 5 of 60 slices shown]
[im 1/60]
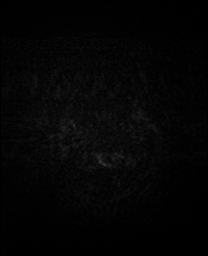
[im 15/60]
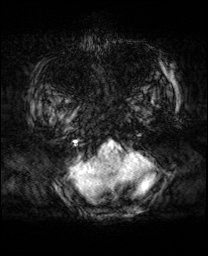
[im 30/60]
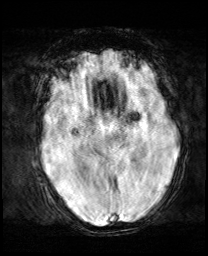
[im 45/60]
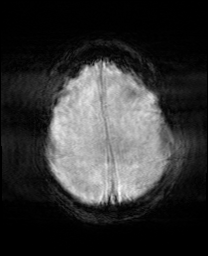
[im 60/60]
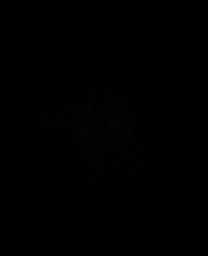

[Series 14: swi_images · axial · 3.0mm · 0.90mm/px · z∈[-87,+89]mm · 5 of 60 slices shown]
[im 1/60]
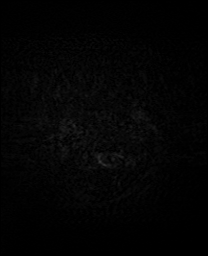
[im 15/60]
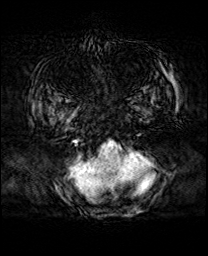
[im 30/60]
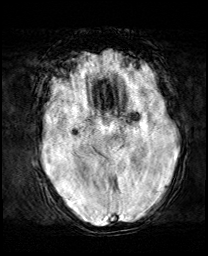
[im 45/60]
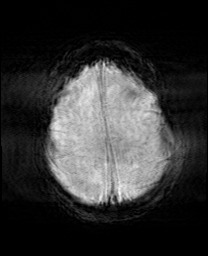
[im 60/60]
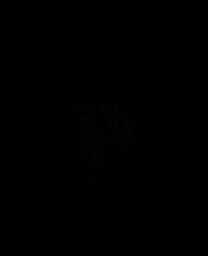

[Series 15: mip_images(sw) · axial · 24.0mm · 0.90mm/px · z∈[-76,+78]mm · 4 of 53 slices shown]
[im 1/53]
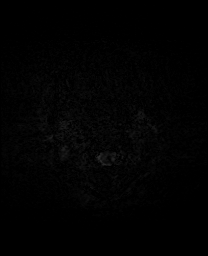
[im 18/53]
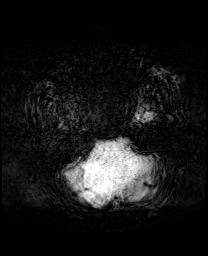
[im 35/53]
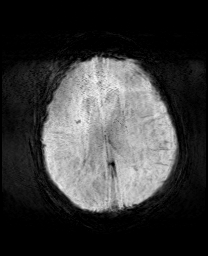
[im 53/53]
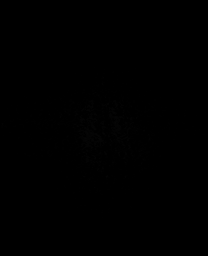

[Series 17: T2 · coronal · 5.0mm · 0.34mm/px · 2 of 29 slices shown (2 of 2)]
[im 1/29]
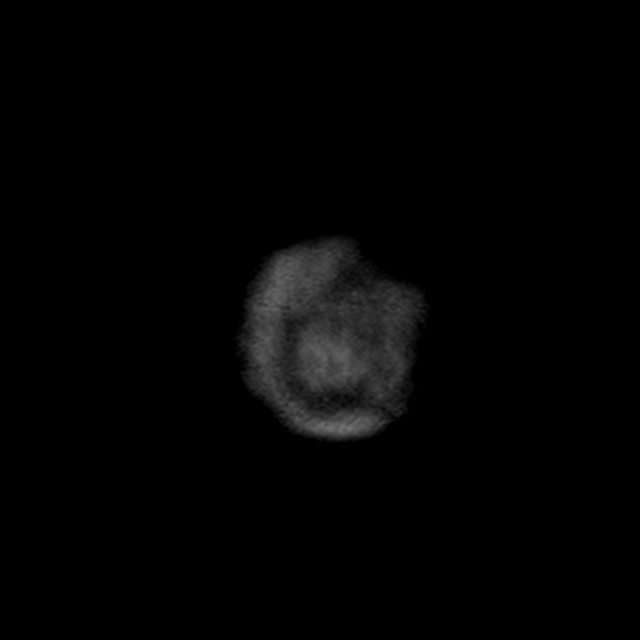
[im 29/29]
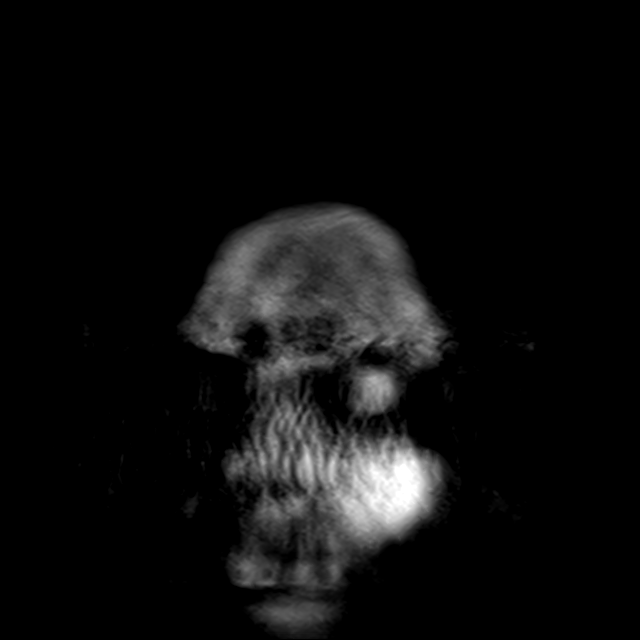

[43 of 48 positions shown; findings below may reference images not displayed]

FINDINGS: Image quality is degraded by motion artifact.

MRI HEAD FINDINGS

Brain: Mild diffusion-weighted hyperintensity involving the
posterior left frontal cortex and left periventricular white matter.
Hemosiderin deposition involving the bilateral basal ganglia. No
acute intracranial hemorrhage. Mild cerebral atrophy with ex vacuo
dilatation. Mild chronic microvascular ischemic changes. Sequela of
remote left corona radiata lacunar insults. No midline shift,
ventriculomegaly or extra-axial fluid collection. No mass lesion.

Vascular: Please see MRA head.

Skull and upper cervical spine: Normal marrow signal.

Sinuses/Orbits: Normal orbits. Sequela of bilateral lens
replacement. Left maxillary sinus mucous retention cyst. No mastoid
effusion.

Other: None.

MRA HEAD FINDINGS

Anterior circulation: Patent normal caliber internal carotid and
anterior cerebral arteries. Bilateral carotid siphon atherosclerotic
disease without significant narrowing. Mild distal right A1 segment
narrowing. Left M2 occlusion involving the inferior trunk. Moderate
short-segment narrowing involving the superior left M2 trunk.

Posterior circulation: Patent normal caliber appearance of the
vertebral, basilar, AICA, and left posterior cerebral artery. Mild
right P1 segment narrowing. Proximally patent bilateral superior
cerebellar arteries. No significant stenosis, proximal occlusion,
aneurysm, or vascular malformation.

Venous sinuses: No evidence of thrombosis.

Anatomic variants: Bilateral posterior communicating arteries are
either hypoplastic or absent.
IMPRESSION: Acute/subacute insults involving the left frontal cortex and left
periventricular white matter.

Sequela of remote bilateral basal ganglia hemorrhages and remote
left corona radiata insult.

Mild cerebral atrophy and chronic microvascular ischemic changes.

Left M2 occlusion involving the inferior trunk. Moderate
short-segment narrowing of the superior left M2 trunk.

Motion degraded exam.

ADDENDUM:
These results were called by telephone at the time of interpretation
on [DATE] at [DATE] to provider MELO , who verbally
acknowledged these results.

*** End of Addendum ***
FINDINGS: Image quality is degraded by motion artifact.

MRI HEAD FINDINGS

Brain: Mild diffusion-weighted hyperintensity involving the
posterior left frontal cortex and left periventricular white matter.
Hemosiderin deposition involving the bilateral basal ganglia. No
acute intracranial hemorrhage. Mild cerebral atrophy with ex vacuo
dilatation. Mild chronic microvascular ischemic changes. Sequela of
remote left corona radiata lacunar insults. No midline shift,
ventriculomegaly or extra-axial fluid collection. No mass lesion.

Vascular: Please see MRA head.

Skull and upper cervical spine: Normal marrow signal.

Sinuses/Orbits: Normal orbits. Sequela of bilateral lens
replacement. Left maxillary sinus mucous retention cyst. No mastoid
effusion.

Other: None.

MRA HEAD FINDINGS

Anterior circulation: Patent normal caliber internal carotid and
anterior cerebral arteries. Bilateral carotid siphon atherosclerotic
disease without significant narrowing. Mild distal right A1 segment
narrowing. Left M2 occlusion involving the inferior trunk. Moderate
short-segment narrowing involving the superior left M2 trunk.

Posterior circulation: Patent normal caliber appearance of the
vertebral, basilar, AICA, and left posterior cerebral artery. Mild
right P1 segment narrowing. Proximally patent bilateral superior
cerebellar arteries. No significant stenosis, proximal occlusion,
aneurysm, or vascular malformation.

Venous sinuses: No evidence of thrombosis.

Anatomic variants: Bilateral posterior communicating arteries are
either hypoplastic or absent.
IMPRESSION: Acute/subacute insults involving the left frontal cortex and left
periventricular white matter.

Sequela of remote bilateral basal ganglia hemorrhages and remote
left corona radiata insult.

Mild cerebral atrophy and chronic microvascular ischemic changes.

Left M2 occlusion involving the inferior trunk. Moderate
short-segment narrowing of the superior left M2 trunk.

Motion degraded exam.

## 2020-04-21 IMAGING — MR MR MRA HEAD W/O CM
1 series · 21 of 48 positions shown · non-contrast
Comparison: [DATE] head CT.
COMPARISON: [DATE] head CT.

Addendum:
CLINICAL DATA: Neuro deficit

EXAM:
MRI HEAD WITHOUT CONTRAST
MRA HEAD WITHOUT CONTRAST
TECHNIQUE: Multiplanar, multiecho pulse sequences of the brain and surrounding
structures were obtained without intravenous contrast. Angiographic
images of the head were obtained using MRA technique without
contrast.

[Series 5: 3d cow · axial · 0.5mm · 0.41mm/px · z∈[-69,+12]mm · 21 of 172 slices shown]
[im 1/172]
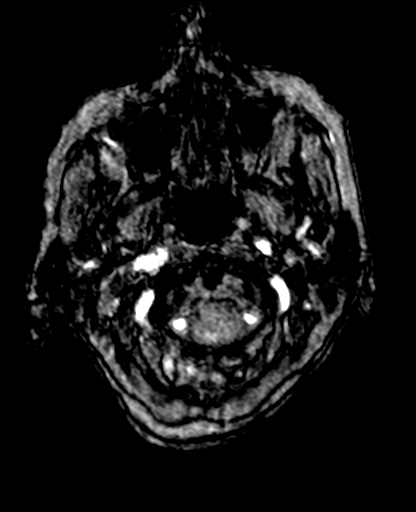
[im 4/172]
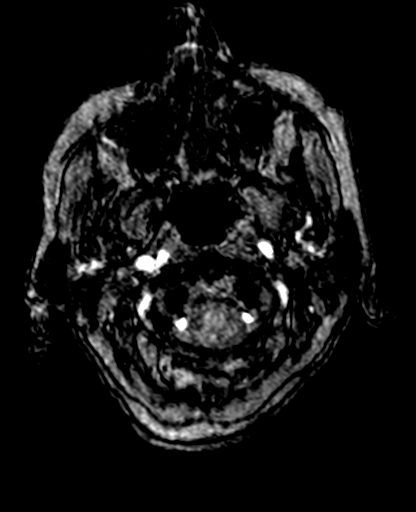
[im 8/172]
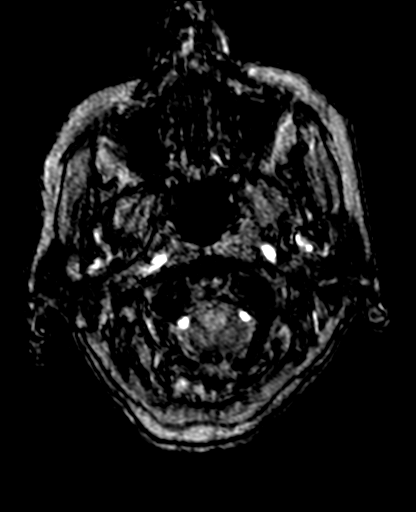
[im 11/172]
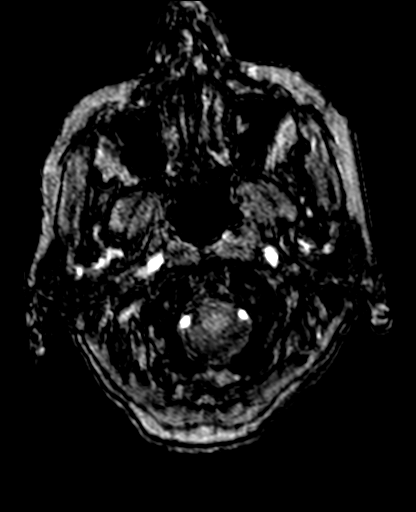
[im 15/172]
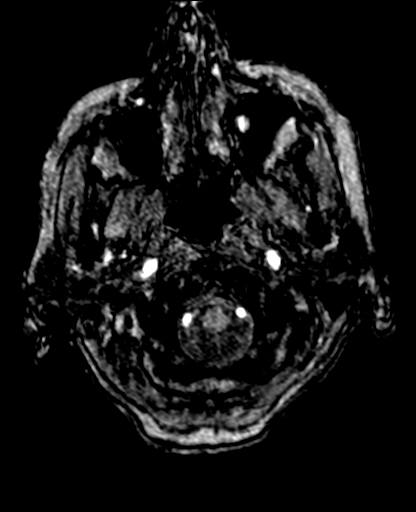
[im 19/172]
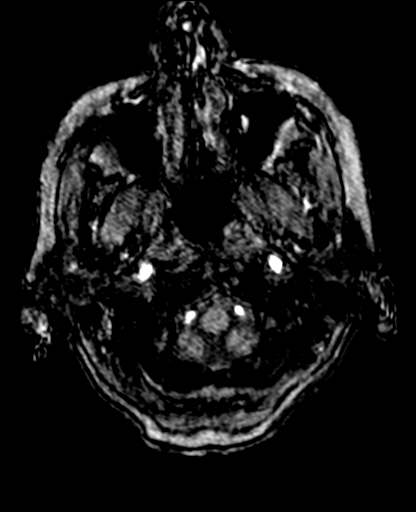
[im 22/172]
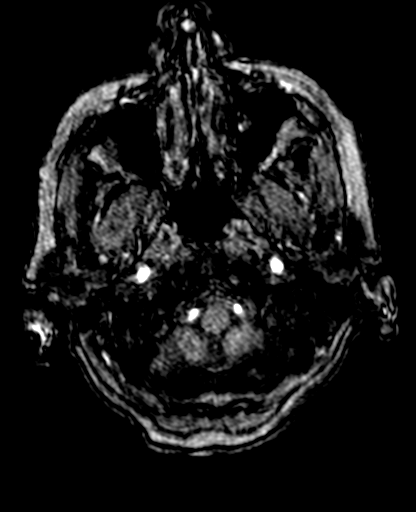
[im 26/172]
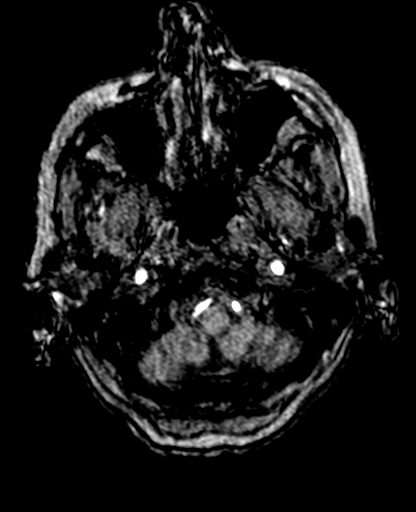
[im 30/172]
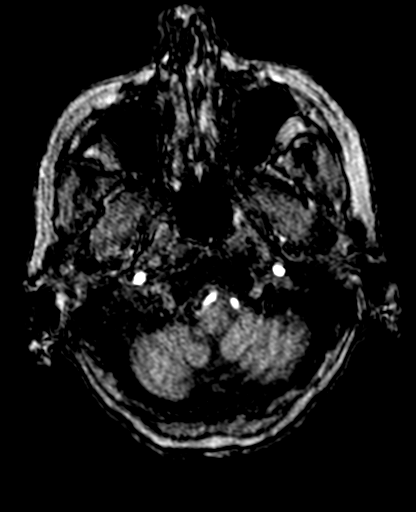
[im 33/172]
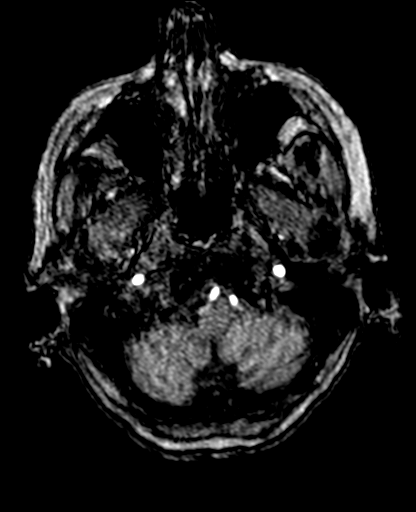
[im 37/172]
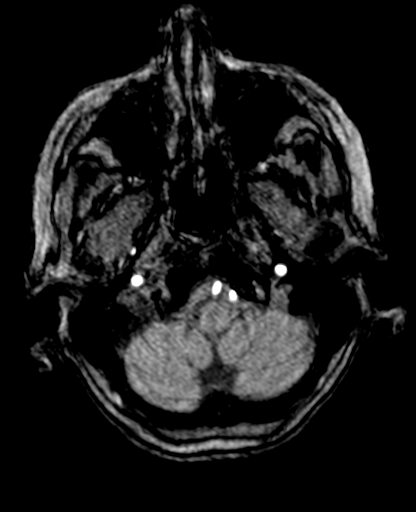
[im 41/172]
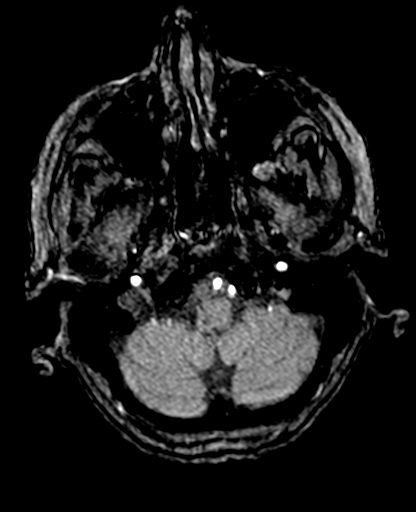
[im 44/172]
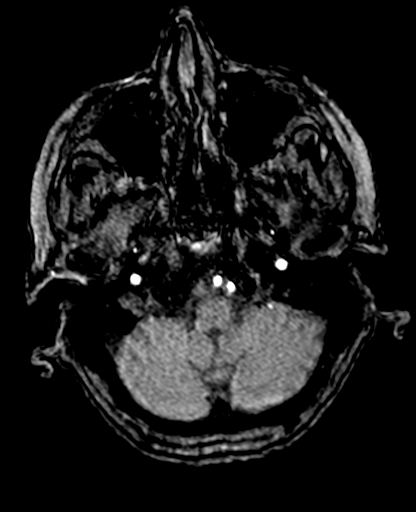
[im 55/172]
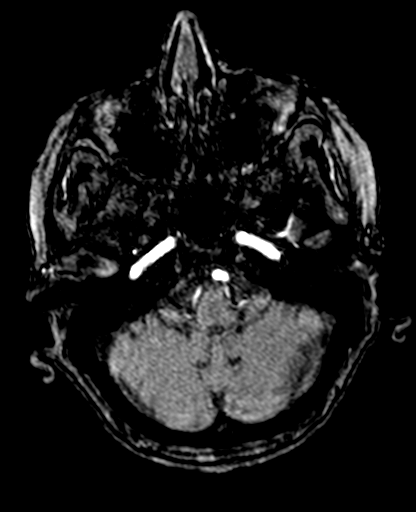
[im 77/172]
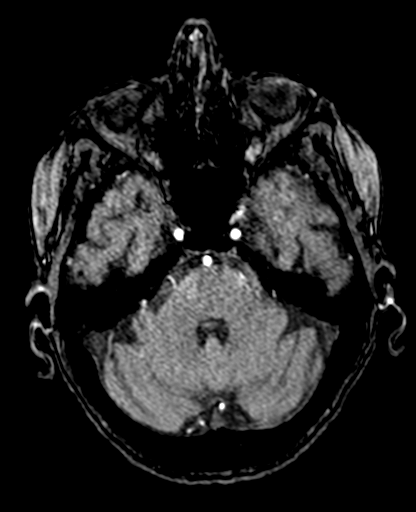
[im 88/172]
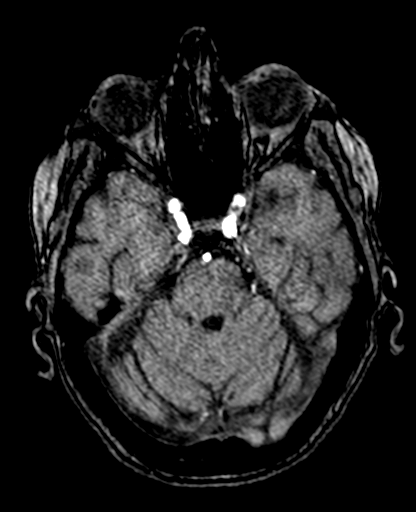
[im 99/172]
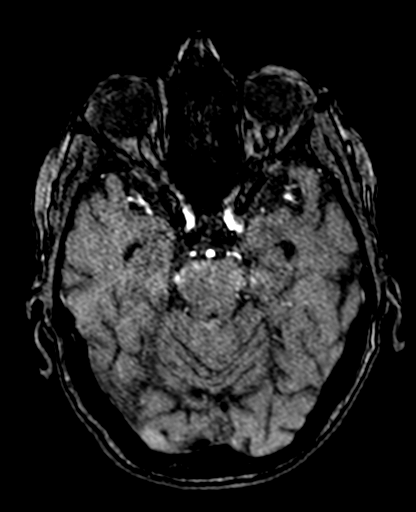
[im 121/172]
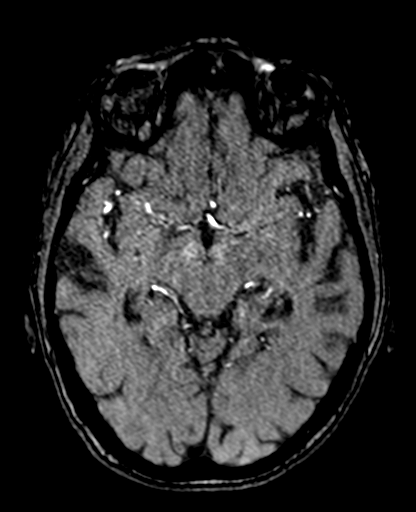
[im 142/172]
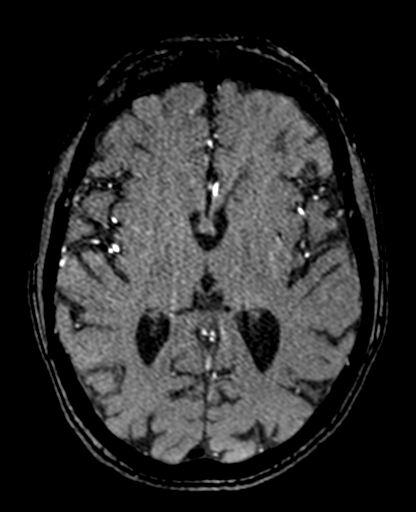
[im 146/172]
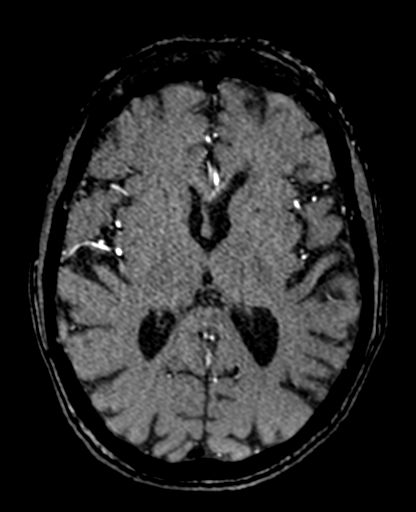
[im 164/172]
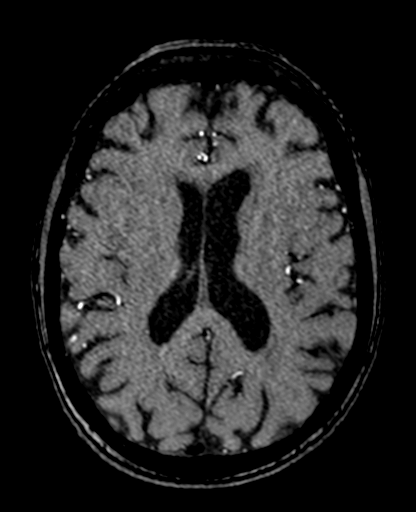

[21 of 48 positions shown; findings below may reference images not displayed]

FINDINGS: Image quality is degraded by motion artifact.

MRI HEAD FINDINGS

Brain: Mild diffusion-weighted hyperintensity involving the
posterior left frontal cortex and left periventricular white matter.
Hemosiderin deposition involving the bilateral basal ganglia. No
acute intracranial hemorrhage. Mild cerebral atrophy with ex vacuo
dilatation. Mild chronic microvascular ischemic changes. Sequela of
remote left corona radiata lacunar insults. No midline shift,
ventriculomegaly or extra-axial fluid collection. No mass lesion.

Vascular: Please see MRA head.

Skull and upper cervical spine: Normal marrow signal.

Sinuses/Orbits: Normal orbits. Sequela of bilateral lens
replacement. Left maxillary sinus mucous retention cyst. No mastoid
effusion.

Other: None.

MRA HEAD FINDINGS

Anterior circulation: Patent normal caliber internal carotid and
anterior cerebral arteries. Bilateral carotid siphon atherosclerotic
disease without significant narrowing. Mild distal right A1 segment
narrowing. Left M2 occlusion involving the inferior trunk. Moderate
short-segment narrowing involving the superior left M2 trunk.

Posterior circulation: Patent normal caliber appearance of the
vertebral, basilar, AICA, and left posterior cerebral artery. Mild
right P1 segment narrowing. Proximally patent bilateral superior
cerebellar arteries. No significant stenosis, proximal occlusion,
aneurysm, or vascular malformation.

Venous sinuses: No evidence of thrombosis.

Anatomic variants: Bilateral posterior communicating arteries are
either hypoplastic or absent.
IMPRESSION: Acute/subacute insults involving the left frontal cortex and left
periventricular white matter.

Sequela of remote bilateral basal ganglia hemorrhages and remote
left corona radiata insult.

Mild cerebral atrophy and chronic microvascular ischemic changes.

Left M2 occlusion involving the inferior trunk. Moderate
short-segment narrowing of the superior left M2 trunk.

Motion degraded exam.

ADDENDUM:
These results were called by telephone at the time of interpretation
on [DATE] at [DATE] to provider MELO , who verbally
acknowledged these results.

*** End of Addendum ***
FINDINGS: Image quality is degraded by motion artifact.

MRI HEAD FINDINGS

Brain: Mild diffusion-weighted hyperintensity involving the
posterior left frontal cortex and left periventricular white matter.
Hemosiderin deposition involving the bilateral basal ganglia. No
acute intracranial hemorrhage. Mild cerebral atrophy with ex vacuo
dilatation. Mild chronic microvascular ischemic changes. Sequela of
remote left corona radiata lacunar insults. No midline shift,
ventriculomegaly or extra-axial fluid collection. No mass lesion.

Vascular: Please see MRA head.

Skull and upper cervical spine: Normal marrow signal.

Sinuses/Orbits: Normal orbits. Sequela of bilateral lens
replacement. Left maxillary sinus mucous retention cyst. No mastoid
effusion.

Other: None.

MRA HEAD FINDINGS

Anterior circulation: Patent normal caliber internal carotid and
anterior cerebral arteries. Bilateral carotid siphon atherosclerotic
disease without significant narrowing. Mild distal right A1 segment
narrowing. Left M2 occlusion involving the inferior trunk. Moderate
short-segment narrowing involving the superior left M2 trunk.

Posterior circulation: Patent normal caliber appearance of the
vertebral, basilar, AICA, and left posterior cerebral artery. Mild
right P1 segment narrowing. Proximally patent bilateral superior
cerebellar arteries. No significant stenosis, proximal occlusion,
aneurysm, or vascular malformation.

Venous sinuses: No evidence of thrombosis.

Anatomic variants: Bilateral posterior communicating arteries are
either hypoplastic or absent.
IMPRESSION: Acute/subacute insults involving the left frontal cortex and left
periventricular white matter.

Sequela of remote bilateral basal ganglia hemorrhages and remote
left corona radiata insult.

Mild cerebral atrophy and chronic microvascular ischemic changes.

Left M2 occlusion involving the inferior trunk. Moderate
short-segment narrowing of the superior left M2 trunk.

Motion degraded exam.

## 2020-04-21 MED ORDER — DIGOXIN 125 MCG PO TABS
0.1250 mg | ORAL_TABLET | Freq: Every day | ORAL | Status: DC
  Administered 2020-04-21 – 2020-04-23 (×3): 0.125 mg via ORAL
  Filled 2020-04-21 (×4): qty 1

## 2020-04-21 MED ORDER — BISOPROLOL FUMARATE 5 MG PO TABS
5.0000 mg | ORAL_TABLET | Freq: Every day | ORAL | Status: DC
  Administered 2020-04-21 – 2020-04-23 (×3): 5 mg via ORAL
  Filled 2020-04-21 (×3): qty 1

## 2020-04-21 MED ORDER — SODIUM CHLORIDE 0.9 % IV SOLN: INTRAVENOUS | Status: DC

## 2020-04-21 NOTE — Progress Notes (Signed)
Occupational Therapy Evaluation Patient Details Name: Victoria Oconnell MRN: [id number] DOB: [birth year] Today's Date: 04/21/2020    History of Present Illness 86 y.o. female presenting with GI bleed with ABLA. Per medical chart, patient with multiple recent hospital admissions for GI bleeds. PMHx significant for CAD w/ stent placement, CHF, diverticulosis, nocturnal O2 dependence, and A-fib.    Clinical Impression   PTA patient was living with her son and was independent with BADLs and IADLs including meal prep and light housekeeping. Patient currently presents slightly below baseline level of function requiring Min guard grossly for BADL transfers and supervision to Min guard to Min A for UB/LB BADLs with use of RW. Patient would benefit from continued acute OT services to maximize safety and independence with self-care tasks in prep for safe d/c home.     Follow Up Recommendations  Home health OT;Supervision - Intermittent    Equipment Recommendations  None recommended by OT    Recommendations for Other Services       Precautions / Restrictions Precautions Precautions: Fall Restrictions Weight Bearing Restrictions: No      Mobility Bed Mobility               General bed mobility comments: Patient seated in recliner upon entry.   Transfers Overall transfer level: Needs assistance Equipment used: Rolling walker (2 wheeled) Transfers: Sit to/from [employer] Sit to Stand: Supervision Stand pivot transfers: Supervision       General transfer comment: Supervision A for STS from recliner with cues for hand placement and supervision A for SPT with use of RW.     Balance Overall balance assessment: Needs assistance Sitting-balance support: Feet supported Sitting balance-Leahy Scale: Fair     Standing balance support: Bilateral upper extremity supported;During functional activity Standing balance-Leahy Scale: Fair Standing balance comment: BUE supported  on RW                           ADL either performed or assessed with clinical judgement   ADL Overall ADL's : Needs assistance/impaired     Grooming: Wash/dry hands;Wash/dry face;Oral care;Brushing hair;Standing;Supervision/safety Grooming Details (indicate cue type and reason): Patient completed grooming tasks in standing at sink level with supervision A and use of RW.          Upper Body Dressing : Set up;Sitting   Lower Body Dressing: Supervision/safety;Sitting/lateral leans;Sit to/from stand Lower Body Dressing Details (indicate cue type and reason): To don footwear seated in recliner  Toilet Transfer: Supervision/safety;RW Toilet Transfer Details (indicate cue type and reason): Simulated with SPT to recliner with supervision A and use of RW.          Functional mobility during ADLs: Min guard;Supervision/safety General ADL Comments: Ambulation for recliner to sink with Min guard progressing to close supervision A.      Vision Baseline Vision/History: Wears glasses Wears Glasses: Reading only Patient Visual Report: No change from baseline Vision Assessment?: No apparent visual deficits     Perception Perception Perception Tested?: No   Praxis Praxis Praxis tested?: Not tested    Pertinent Vitals/Pain Pain Assessment: Faces Faces Pain Scale: No hurt     Hand Dominance Right   Extremity/Trunk Assessment Upper Extremity Assessment Upper Extremity Assessment: Generalized weakness   Lower Extremity Assessment Lower Extremity Assessment: Defer to PT evaluation       Communication Communication Communication: No difficulties   Cognition Arousal/Alertness: Awake/alert Behavior During Therapy: WFL for tasks assessed/performed Overall Cognitive Status: Within  Functional Limits for tasks assessed                                     General Comments       Exercises     Shoulder Instructions      Home Living Family/patient  expects to be discharged to:: Private residence Living Arrangements: Children (38 yo son lives in basement apt) Available Help at Discharge: Family;Available 24 hours/day Type of Home: House Home Access: Stairs to enter [employer] of Steps: 1 (tall step to porch) Entrance Stairs-Rails: Right Home Layout: Two level;Able to live on main level with bedroom/bathroom     Bathroom Shower/Tub: Tub/shower unit         Home Equipment: Walker - 4 wheels;Cane - single point;Shower seat   Additional Comments: Reports has a vanity bedside      Prior Functioning/Environment Level of Independence: Independent with assistive device(s)        Comments: Patient reports independence with meal prep and light housekeeping and use of SPC in home and community dwellings. Patient was sitting to shower.         OT Problem List: Decreased activity tolerance      OT Treatment/Interventions: Self-care/ADL training;Therapeutic exercise;Energy conservation;DME and/or AE instruction;Therapeutic activities;Patient/family education;Balance training    OT Goals(Current goals can be found in the care plan section) Acute Rehab OT Goals Patient Stated Goal: To return home OT Goal Formulation: With patient Time For Goal Achievement: 05/05/20 Potential to Achieve Goals: Good ADL Goals Pt Will Perform Grooming: with modified independence;standing Pt Will Perform Upper Body Dressing: Independently;sitting Pt Will Perform Lower Body Dressing: with modified independence;sitting/lateral leans;sit to/from stand Pt Will Transfer to Toilet: with modified independence;ambulating Pt Will Perform Toileting - Clothing Manipulation and hygiene: with modified independence;sitting/lateral leans;sit to/from stand  OT Frequency: Min 2X/week   Barriers to D/C:            Co-evaluation              AM-PAC OT "6 Clicks" Daily Activity     Outcome Measure Help from another person eating meals?:  None Help from another person taking care of personal grooming?: A Little Help from another person toileting, which includes using toliet, bedpan, or urinal?: A Little Help from another person bathing (including washing, rinsing, drying)?: A Little Help from another person to put on and taking off regular upper body clothing?: None Help from another person to put on and taking off regular lower body clothing?: A Little 6 Click Score: 20   End of Session Equipment Utilized During Treatment: Gait belt;Rolling walker Nurse Communication: Mobility status  Activity Tolerance: Patient tolerated treatment well Patient left: in chair;with call bell/phone within reach;with chair alarm set  OT Visit Diagnosis: Unsteadiness on feet (R26.81)                Time: 7902-4097 OT Time Calculation (min): 13 min Charges:  OT General Charges $OT Visit: 1 Visit OT Evaluation $OT Eval Low Complexity: 1 Low  Evonne Rinks H. OTR/L Supplemental OT, Department of rehab services [phone]  Gaylia Kassel R H.  04/21/2020, 2:20 PM

## 2020-04-21 NOTE — Progress Notes (Signed)
VASCULAR LAB    Carotid duplex has been performed.  See CV proc for preliminary results.   Matthewjames Petrasek, RVT 04/21/2020, 6:31 PM

## 2020-04-21 NOTE — Progress Notes (Addendum)
Pt pushed nurse call bell for asst. NT arrived to room to find pt confused, This rn arrived to room to find pt sitting in chair, c/o not able to see or visualize, pt confused speech clear, grips bilateral weak, no facial droop, pt tracking. Pt able to stand and pivot to asst rn back to bed. Charge nurse to bedside, this rn suggesting to page rapid response to bedside.  Dr Benjamine Mola to bedside per rn request. Pt daughter updated by dr Benjamine Mola.

## 2020-04-21 NOTE — Progress Notes (Signed)
Called by RN. NIHSS worse with worse dysarthria and ataxia. Unfortunately, she has embolic strokes and heparinization in the setting of acute GIB needing transfusions is not an option. Keep on frequent neurochecks but there is not much to offer from a intervention standpoint Call neurology with questions whenever needed and do notify of changes as ordered.  -- Milon Dikes, MD [facility] Pager: [phone] If 7pm to 7am, please call on call as listed on AMION.

## 2020-04-21 NOTE — Progress Notes (Addendum)
Progress Note    Victoria Oconnell  AJO:[id number] DOB: [birth year]  DOA: 05-10-2020 PCP: Alben Deeds, MD    Brief Narrative:     Medical records reviewed and are as summarized below:  Victoria Oconnell is an 86 y.o. female with medical history significant of afib, previously on Xarelto multiple recent hospitalizations for GI bleeds presenting with the same; Eliquis was stopped 1 week ago for this reason.  She has been having bloody stools.  She has had 2 recent hospital admissions in [place] - 3 weeks ago with scope, on Xarelto for afib, no source identified; she began bleeding again at home and so readmitted and Hgb was 4.9.  She had been inadvertently taking Xarelto.  Scoped twice, found a polyp and diverticulosis.  Were considering a Watchman due to afib and considering a laparoscopic procedure.  Her DIL works for EP here and so they were working on that.  She was discharged on Saturday of last weekend and followed up with PCP.  Hgb was 9.5 at d/c and above 8 Tuesday at PCP f/u.  Last night, she felt weak, tired, BP was 93/62.  She felt felt lightheaded.  She missed her wheelchair during transfer and so they decided to bring her in to [facility].  Initially had EGD and then had 3 subsequent colonoscopies.  She is off the Xarelto; last dose was almost 2 weeks ago?  Assessment/Plan:   Principal Problem:   Acute GI bleeding Active Problems:   Essential hypertension   Diverticulosis   CHF (congestive heart failure) (HCC)   CAD (coronary artery disease)   Atrial fibrillation (HCC)   Asthma   Dependence on nocturnal oxygen therapy   Dementia (HCC)   Anemia due to blood loss   New embolic CVAs -patient had an abrupt mental status change on 9/26 around 11 AM -Patient called out stating that she could not see -Patient was also having receptive aphasia -Code stroke called Dr. Bess Harvest help is appreciated -MRI shows acute/subacute insults involving the left frontal cortex and left periventricular  white matter -will transfer to SDU for now  GI Bleeding with ABLA -Patient with 2 prior recent hospitalizations with reported EGD x 1 and colonoscopy x 3 showing diverticulosis -She was previously on Xarelto but this has been stopped; her last dose was over a week ago and possibly 2 weeks ago -s/p 2 units PRBC -Continue to monitor for recurrent bleeding -GI consult appreciated: s/p colonoscopy:  Diverticulosis in the sigmoid colon, no source of bleeding found.   -Capsule endoscopy was also unrevealing site of bleeding  Afib -Patient with long-standing afib -reported to be bradycardic-we will continue telemetry -Xarelto has been on hold since about 9/11 -Resume dig and bisoprolol at a lower dose  Asthma, on nocturnal O2 -Continue Albuterol -She has been reportedly taking both Symbicort and Flovent, which appears to be redundant -Will order Dulera now (formulary substitution for Symbicort) -Continue nocturnal O2 (although it is not entirely clear why she needs this at this time)  Dementia -It is not clear how much this has been discussed with patient/family, although her daughter-in-law does acknowledge mild cognitive impairment -She is taking Prozac and this has been continued -Appears mild  Hypothyroidism -Continue Synthroid at current dose for now -outpatient follow up  HTN -Hold Cozaar for now  Renal dysfunction --monitor  CAD -Hold ASA for now, likely need to resume soon if possible -Continue Pravachol -She has prior remote stent  CHF -Uncertain whether systolic or diastolic since no records  are available -Takes ACE/BB/Lasix at home -Appears to be compensated at this time -resume lasix as able     Family Communication/Anticipated D/C date and plan/Code Status   DVT prophylaxis: scd Code Status: Full Code.  Disposition Plan: Status is: Inpatient  Remains inpatient appropriate because:Inpatient level of care appropriate due to severity of  illness   Dispo: The patient is from: Home              Anticipated d/c is to: SNF              Anticipated d/c date is: 2 days              Patient currently is not medically stable to d/c.  Needs stroke work-up         Medical Consultants:   GI neurology    Subjective:   This AM around 8, doing well, around 11 AM had an abrupt change in mental status with c/o difficulty seeing and speech difficulty  Objective:    Vitals:   04/21/20 0800 04/21/20 0805 04/21/20 1121 04/21/20 1300  BP:   (!) 154/91 (!) 153/93  Pulse: 72 73 94 72  Resp:      Temp:      TempSrc:      SpO2:   97% 96%  Weight:      Height:        Intake/Output Summary (Last 24 hours) at 04/21/2020 1406 Last data filed at 04/21/2020 1100 Gross per 24 hour  Intake 2002.78 ml  Output --  Net 2002.78 ml   Filed Weights   04/22/20 0709  Weight: 61.2 kg    Exam:  General: Appearance:    Well developed, well nourished female in no acute distress     Lungs:     respirations unlabored  Heart:    Normal heart rate. Irregular rhythm, No murmurs, rubs, or gallops.   MS:   All extremities are intact.   Neurologic:   Awake, alert, oriented x 3 upon initial exam but on repeat evaluation: she has repetitive speech, difficulty with following commands fully and reported visual deficits-- moves all 4 ext     Data Reviewed:   I have personally reviewed following labs and imaging studies:  Labs: Labs show the following:   Basic Metabolic Panel: Recent Labs  Lab 04/19/20 0000 04/19/20 0000 22-Apr-2020 0633 04/21/20 0530  NA 132*  --  139 133*  K 4.7   < > 4.2 3.8  CL 95*  --  105 102  CO2 27  --  27 25  GLUCOSE 119*  --  89 95  BUN 21  --  13 10  CREATININE 1.25*  --  1.07* 0.95  CALCIUM 9.1  --  8.8* 8.4*   < > = values in this interval not displayed.   GFR Estimated Creatinine Clearance: 37.5 mL/min (by C-G formula based on SCr of 0.95 mg/dL). Liver Function Tests: No results for input(s):  AST, ALT, ALKPHOS, BILITOT, PROT, ALBUMIN in the last 168 hours. No results for input(s): LIPASE, AMYLASE in the last 168 hours. No results for input(s): AMMONIA in the last 168 hours. Coagulation profile Recent Labs  Lab 04/19/20 0000  INR 1.0    CBC: Recent Labs  Lab 04/19/20 0000 04/19/20 0000 04/19/20 0455 04/19/20 1649 April 22, 2020 0633 22-Apr-2020 1641 04/21/20 0530  WBC 7.8  --   --  6.9 6.7 8.4 7.5  HGB 7.2*   < > 6.8* 9.9* 10.0* 9.7* 9.0*  HCT 23.2*   < > 22.7* 30.6* 31.8* 31.3* 28.4*  MCV 95.9  --   --  89.5 89.6 89.9 91.0  PLT 413*  --   --  336 323 325 346   < > = values in this interval not displayed.   Cardiac Enzymes: No results for input(s): CKTOTAL, CKMB, CKMBINDEX, TROPONINI in the last 168 hours. BNP (last 3 results) No results for input(s): PROBNP in the last 8760 hours. CBG: Recent Labs  Lab 04/21/20 1122  GLUCAP 100*   D-Dimer: No results for input(s): DDIMER in the last 72 hours. Hgb A1c: No results for input(s): HGBA1C in the last 72 hours. Lipid Profile: No results for input(s): CHOL, HDL, LDLCALC, TRIG, CHOLHDL, LDLDIRECT in the last 72 hours. Thyroid function studies: Recent Labs    04/19/20 1649  TSH 6.325*   Anemia work up: No results for input(s): VITAMINB12, FOLATE, FERRITIN, TIBC, IRON, RETICCTPCT in the last 72 hours. Sepsis Labs: Recent Labs  Lab 04/19/20 1649 04/08/2020 0633 04/06/2020 1641 04/21/20 0530  WBC 6.9 6.7 8.4 7.5    Microbiology Recent Results (from the past 240 hour(s))  Respiratory Panel by RT PCR (Flu A&B, Covid) - Nasopharyngeal Swab     Status: None   Collection Time: 04/19/20  6:33 AM   Specimen: Nasopharyngeal Swab  Result Value Ref Range Status   SARS Coronavirus 2 by RT PCR NEGATIVE NEGATIVE Final    Comment: (NOTE) SARS-CoV-2 target nucleic acids are NOT DETECTED.  The SARS-CoV-2 RNA is generally detectable in upper respiratoy specimens during the acute phase of infection. The lowest concentration  of SARS-CoV-2 viral copies this assay can detect is 131 copies/mL. A negative result does not preclude SARS-Cov-2 infection and should not be used as the sole basis for treatment or other patient management decisions. A negative result may occur with  improper specimen collection/handling, submission of specimen other than nasopharyngeal swab, presence of viral mutation(s) within the areas targeted by this assay, and inadequate number of viral copies (<131 copies/mL). A negative result must be combined with clinical observations, patient history, and epidemiological information. The expected result is Negative.  Fact Sheet for Patients:  https://www.moore.com/  Fact Sheet for Healthcare Providers:  https://www.young.biz/  This test is no t yet approved or cleared by the [place] FDA and  has been authorized for detection and/or diagnosis of SARS-CoV-2 by FDA under an Emergency Use Authorization (EUA). This EUA will remain  in effect (meaning this test can be used) for the duration of the COVID-19 declaration under Section 564(b)(1) of the Act, 21 U.S.C. section 360bbb-3(b)(1), unless the authorization is terminated or revoked sooner.     Influenza A by PCR NEGATIVE NEGATIVE Final   Influenza B by PCR NEGATIVE NEGATIVE Final    Comment: (NOTE) The Xpert Xpress SARS-CoV-2/FLU/RSV assay is intended as an aid in  the diagnosis of influenza from Nasopharyngeal swab specimens and  should not be used as a sole basis for treatment. Nasal washings and  aspirates are unacceptable for Xpert Xpress SARS-CoV-2/FLU/RSV  testing.  Fact Sheet for Patients: https://www.moore.com/  Fact Sheet for Healthcare Providers: https://www.young.biz/  This test is not yet approved or cleared by the [place] FDA and  has been authorized for detection and/or diagnosis of SARS-CoV-2 by  FDA under an Emergency Use  Authorization (EUA). This EUA will remain  in effect (meaning this test can be used) for the duration of the  Covid-19 declaration under Section 564(b)(1) of the Act, 21  U.S.C. section 360bbb-3(b)(1), unless the authorization is  terminated or revoked. Performed at [facility], [address] [place], [place] [address]     Procedures and diagnostic studies:  MR ANGIO HEAD WO CONTRAST  Addendum Date: 04/21/2020   ADDENDUM REPORT: 04/21/2020 13:47 ADDENDUM: These results were called by telephone at the time of interpretation on 04/21/2020 at 1:46 pm to provider [facility] , who verbally acknowledged these results. Electronically Signed   By: Stana Buntinghikanele  Emekauwa M.D.   On: 04/21/2020 13:47   Result Date: 04/21/2020 CLINICAL DATA:  Neuro deficit EXAM: MRI HEAD WITHOUT CONTRAST MRA HEAD WITHOUT CONTRAST TECHNIQUE: Multiplanar, multiecho pulse sequences of the brain and surrounding structures were obtained without intravenous contrast. Angiographic images of the head were obtained using MRA technique without contrast. COMPARISON:  04/21/2020 head CT. FINDINGS: Image quality is degraded by motion artifact. MRI HEAD FINDINGS Brain: Mild diffusion-weighted hyperintensity involving the posterior left frontal cortex and left periventricular white matter. Hemosiderin deposition involving the bilateral basal ganglia. No acute intracranial hemorrhage. Mild cerebral atrophy with ex vacuo dilatation. Mild chronic microvascular ischemic changes. Sequela of remote left corona radiata lacunar insults. No midline shift, ventriculomegaly or extra-axial fluid collection. No mass lesion. Vascular: Please see MRA head. Skull and upper cervical spine: Normal marrow signal. Sinuses/Orbits: Normal orbits. Sequela of bilateral lens replacement. Left maxillary sinus mucous retention cyst. No mastoid effusion. Other: None. MRA HEAD FINDINGS Anterior circulation: Patent normal caliber internal carotid and anterior  cerebral arteries. Bilateral carotid siphon atherosclerotic disease without significant narrowing. Mild distal right A1 segment narrowing. Left M2 occlusion involving the inferior trunk. Moderate short-segment narrowing involving the superior left M2 trunk. Posterior circulation: Patent normal caliber appearance of the vertebral, basilar, AICA, and left posterior cerebral artery. Mild right P1 segment narrowing. Proximally patent bilateral superior cerebellar arteries. No significant stenosis, proximal occlusion, aneurysm, or vascular malformation. Venous sinuses: No evidence of thrombosis. Anatomic variants: Bilateral posterior communicating arteries are either hypoplastic or absent. IMPRESSION: Acute/subacute insults involving the left frontal cortex and left periventricular white matter. Sequela of remote bilateral basal ganglia hemorrhages and remote left corona radiata insult. Mild cerebral atrophy and chronic microvascular ischemic changes. Left M2 occlusion involving the inferior trunk. Moderate short-segment narrowing of the superior left M2 trunk. Motion degraded exam. Electronically Signed: By: Stana Buntinghikanele  Emekauwa M.D. On: 04/21/2020 13:37   MR BRAIN WO CONTRAST  Addendum Date: 04/21/2020   ADDENDUM REPORT: 04/21/2020 13:47 ADDENDUM: These results were called by telephone at the time of interpretation on 04/21/2020 at 1:46 pm to provider [facility] , who verbally acknowledged these results. Electronically Signed   By: Stana Buntinghikanele  Emekauwa M.D.   On: 04/21/2020 13:47   Result Date: 04/21/2020 CLINICAL DATA:  Neuro deficit EXAM: MRI HEAD WITHOUT CONTRAST MRA HEAD WITHOUT CONTRAST TECHNIQUE: Multiplanar, multiecho pulse sequences of the brain and surrounding structures were obtained without intravenous contrast. Angiographic images of the head were obtained using MRA technique without contrast. COMPARISON:  04/21/2020 head CT. FINDINGS: Image quality is degraded by motion artifact. MRI HEAD FINDINGS  Brain: Mild diffusion-weighted hyperintensity involving the posterior left frontal cortex and left periventricular white matter. Hemosiderin deposition involving the bilateral basal ganglia. No acute intracranial hemorrhage. Mild cerebral atrophy with ex vacuo dilatation. Mild chronic microvascular ischemic changes. Sequela of remote left corona radiata lacunar insults. No midline shift, ventriculomegaly or extra-axial fluid collection. No mass lesion. Vascular: Please see MRA head. Skull and upper cervical spine: Normal marrow signal. Sinuses/Orbits: Normal orbits. Sequela of bilateral lens  replacement. Left maxillary sinus mucous retention cyst. No mastoid effusion. Other: None. MRA HEAD FINDINGS Anterior circulation: Patent normal caliber internal carotid and anterior cerebral arteries. Bilateral carotid siphon atherosclerotic disease without significant narrowing. Mild distal right A1 segment narrowing. Left M2 occlusion involving the inferior trunk. Moderate short-segment narrowing involving the superior left M2 trunk. Posterior circulation: Patent normal caliber appearance of the vertebral, basilar, AICA, and left posterior cerebral artery. Mild right P1 segment narrowing. Proximally patent bilateral superior cerebellar arteries. No significant stenosis, proximal occlusion, aneurysm, or vascular malformation. Venous sinuses: No evidence of thrombosis. Anatomic variants: Bilateral posterior communicating arteries are either hypoplastic or absent. IMPRESSION: Acute/subacute insults involving the left frontal cortex and left periventricular white matter. Sequela of remote bilateral basal ganglia hemorrhages and remote left corona radiata insult. Mild cerebral atrophy and chronic microvascular ischemic changes. Left M2 occlusion involving the inferior trunk. Moderate short-segment narrowing of the superior left M2 trunk. Motion degraded exam. Electronically Signed: By: [name] M.D. On: 04/21/2020 13:37    CT HEAD CODE STROKE WO CONTRAST  Result Date: 04/21/2020 CLINICAL DATA:  Code stroke. Altered mental status and speech difficulty EXAM: CT HEAD WITHOUT CONTRAST TECHNIQUE: Contiguous axial images were obtained from the base of the skull through the vertex without intravenous contrast. COMPARISON:  None. FINDINGS: Brain: No evidence of acute infarction, hemorrhage, hydrocephalus, extra-axial collection or mass lesion/mass effect. Cerebral volume loss and mild chronic white matter disease for age. Vascular: No hyperdense vessel or unexpected calcification. Skull: Normal. Negative for fracture or focal lesion. Sinuses/Orbits: No acute finding. Other: These results were communicated to Dr. Wilford Corner at 12:20 pmon 9/26/2021by text page via the [facility] messaging system. ASPECTS [place] Stroke Program Early CT Score) - Ganglionic level infarction (caudate, lentiform nuclei, internal capsule, insula, M1-M3 cortex): 7 - Supraganglionic infarction (M4-M6 cortex): 3 Total score (0-10 with 10 being normal): 10 IMPRESSION: No acute finding.ASPECTS is 10. Electronically Signed   By: Marnee Spring M.D.   On: 04/21/2020 12:21    Medications:   . bisoprolol  5 mg Oral Daily  . digoxin  0.125 mg Oral Daily  . FLUoxetine  40 mg Oral Daily  . levothyroxine  88 mcg Oral QAC breakfast  . mometasone-formoterol  2 puff Inhalation BID  . pantoprazole (PROTONIX) IV  40 mg Intravenous Q24H  . pravastatin  40 mg Oral QHS   Continuous Infusions:    LOS: 2 days   Joseph Art  [facility]   How to contact the [facility] Attending or Consulting provider 7A - 7P or covering provider during after hours 7P -7A, for this patient?  1. Check the care team in [facility] and look for a) attending/consulting [facility] provider listed and b) the [facility] team listed 2. Log into [email or web address] and use [place]'s universal password to access. If you do not have the password, please contact the hospital operator. 3. Locate the [facility] provider you  are looking for under [facility] and page to a number that you can be directly reached. 4. If you still have difficulty reaching the provider, please page the [facility] (Director on Call) for the Hospitalists listed on amion for assistance.  04/21/2020, 2:06 PM

## 2020-04-21 NOTE — Evaluation (Signed)
Clinical/Bedside Swallow Evaluation Patient Details  Name: Victoria Oconnell MRN: [id number] Date of Birth: [birth year]  Today's Date: 04/21/2020 Time: SLP Start Time (ACUTE ONLY): 1356 SLP Stop Time (ACUTE ONLY): 1410 SLP Time Calculation (min) (ACUTE ONLY): 14 min  Past Medical History:  Past Medical History:  Diagnosis Date  . Asthma   . Atrial fibrillation (HCC)   . CAD (coronary artery disease)    has stent  . CHF (congestive heart failure) (HCC)   . Dementia (HCC)    mild  . Dependence on nocturnal oxygen therapy    uncertain etiology  . Diverticulosis   . Essential hypertension   . GI bleeding 04/19/2020   Past Surgical History:  Past Surgical History:  Procedure Laterality Date  . ABDOMINAL HYSTERECTOMY    . BREAST LUMPECTOMY     HPI:  86 y.o. female presenting with GI bleed with ABLA. Per medical chart, patient with multiple recent hospital admissions for GI bleeds. PMHx significant for CAD w/ stent placement, CHF, diverticulosis, nocturnal O2 dependence, and A-fib. Code stroke was initiated on 9/26 secondary to speech change.  MRI was remarkable for "Acute/subacute insults involving the left frontal cortex and left periventricular white matter."    Assessment / Plan / Recommendation Clinical Impression  Pt was seen for a bedside swallow evaluation in the setting of a code stroke called earlier today.  She was encountered awake/alert in bed and was agreeable to this evaluation.  RN reported that pt was tolerating regular solids and thin liquids well prior to the code stroke and pt denied a hx of dysphagia.  Oral mechanism examination was unremarkable and pt consumed trials of thin liquid, puree, and regular solids.  Mastication of regular solids was mildly prolonged and minimal oral residue was observed; however, mastication was functional and pt was able to independently clear residue with a liquid wash.  No overt s/sx of aspiration were observed with any trials.  Recommend  regular solids and thin liquids with medications administered whole with liquid or in puree (per pt preference).  Pt may benefit from intermittent supervision during meals to cue for compensatory strategies (listed below).  No further skilled ST is warranted at this time.  Please re-consult if additional needs arise.     SLP Visit Diagnosis: Dysphagia, unspecified (R13.10)    Aspiration Risk  Mild aspiration risk    Diet Recommendation Regular;Thin liquid   Liquid Administration via: Cup;Straw Medication Administration: Whole meds with liquid Supervision: Patient able to self feed;Intermittent supervision to cue for compensatory strategies Compensations: Slow rate;Small sips/bites Postural Changes: Seated upright at 90 degrees    Other  Recommendations Oral Care Recommendations: Oral care BID   Follow up Recommendations None        Swallow Study   General HPI: 86 y.o. female presenting with GI bleed with ABLA. Per medical chart, patient with multiple recent hospital admissions for GI bleeds. PMHx significant for CAD w/ stent placement, CHF, diverticulosis, nocturnal O2 dependence, and A-fib. Code stroke was initiated on 9/26 secondary to speech change.  MRI was remarkable for "Acute/subacute insults involving the left frontal cortex and left periventricular white matter."  Type of Study: Bedside Swallow Evaluation Previous Swallow Assessment: None Diet Prior to this Study: NPO Temperature Spikes Noted: Yes Respiratory Status: Room air History of Recent Intubation: No Behavior/Cognition: Alert;Cooperative;Pleasant mood Oral Cavity Assessment: Within Functional Limits Oral Care Completed by SLP: No Oral Cavity - Dentition: Adequate natural dentition Vision: Functional for self-feeding Self-Feeding Abilities: Able to feed self Patient  Positioning: Upright in bed Baseline Vocal Quality: Normal Volitional Swallow: Able to elicit    Oral/Motor/Sensory Function Overall Oral  Motor/Sensory Function: Within functional limits   Ice Chips Ice chips: Not tested   Thin Liquid Thin Liquid: Within functional limits Presentation: Straw;Self Fed    Nectar Thick Nectar Thick Liquid: Not tested   Honey Thick Honey Thick Liquid: Not tested   Puree Puree: Within functional limits Presentation: Spoon;Self Fed   Solid     Solid: Impaired Presentation: Self Fed Oral Phase Functional Implications: Prolonged oral transit;Oral residue     Villa Herb., M.S., CCC-SLP Acute Rehabilitation Services Office: [phone]  Victoria Oconnell Victoria Oconnell 04/21/2020,2:25 PM

## 2020-04-21 NOTE — Significant Event (Addendum)
Rapid Response Event Note   Reason for Call :  Possible stroke  Initial Focused Assessment:  MD had RN call rapid on patient because she had a change in vision.  Patient is her because of GI bleed and her afib anticoagulation has been held until GI bleed has resolved.  PT was working with the patient, got her into the chair, and then she had a dramatic change in vision.  Patient was also saying "I don't know what's happening" and "yes ma'am" to me.  She would track voices but was unable to answer correct visual questions. Her LKW was 1130. Code stroke called and Dr. Wilford Corner ordered CT and MRI (due to patient's contrast allergy).  Patient was returned safely to room afterwards.  After CT scan patient was able to identify the correct amount of fingers I was holding up, which was different than earlier.  BP 154/91 ECG 94 Resp 18     Interventions:  Code stroke activated, CT scan, MRI, vitals taken, NIHSS performed  Plan of Care:  Patient will have an inpatient neuro workup performed   Event Summary:   MD Notified: [facility] Call Time: 1159 Arrival Time: 1200 End Time: 1300  Andrey Spearman, RN

## 2020-04-21 NOTE — Evaluation (Signed)
Physical Therapy Evaluation Patient Details Name: Victoria Oconnell MRN: [id number] DOB: 1929/10/05 Today's Date: 04/21/2020   History of Present Illness  86 y.o. female presenting with GI bleed with ABLA. Per medical chart, patient with multiple recent hospital admissions for GI bleeds. PMHx significant for CAD w/ stent placement, CHF, diverticulosis, nocturnal O2 dependence, and A-fib.   Clinical Impression   Pt admitted with above diagnosis. Comes from home where her son lives with her; Independent prior to admission, and the weeks leading up to this admission; Presents to PT with generalized weakness, and decr activity tolerance;  Pt currently with functional limitations due to the deficits listed below (see PT Problem List). Pt will benefit from skilled PT to increase their independence and safety with mobility to allow discharge to the venue listed below.    Of note: this note reflects activities done during PT eval performed earlier this am, before the Code Stroke; Will re-evaluate pt next session     Follow Up Recommendations Home health PT;Supervision/Assistance - 24 hour    Equipment Recommendations  3in1 (PT)    Recommendations for Other Services       Precautions / Restrictions Precautions Precautions: Fall Restrictions Weight Bearing Restrictions: No      Mobility  Bed Mobility Overal bed mobility: Needs Assistance Bed Mobility: Supine to Sit     Supine to sit: Min assist     General bed mobility comments: min handheld assist to pull to sit  Transfers Overall transfer level: Needs assistance Equipment used: Rolling walker (2 wheeled) Transfers: Sit to/from Stand Sit to Stand: Supervision Stand pivot transfers: Supervision       General transfer comment: Cues for ahnd placement and safety  Ambulation/Gait Ambulation/Gait assistance: Min guard (without physical contact) Gait Distance (Feet): 15 Feet Assistive device: Rolling walker (2 wheeled) Gait  Pattern/deviations: Step-through pattern Gait velocity: slowed   General Gait Details: cues to self-monitor for activity tolerance  Stairs            Wheelchair Mobility    Modified Rankin (Stroke Patients Only)       Balance Overall balance assessment: Needs assistance Sitting-balance support: Feet supported Sitting balance-Leahy Scale: Fair     Standing balance support: Bilateral upper extremity supported;During functional activity Standing balance-Leahy Scale: Fair Standing balance comment: BUE supported on RW                             Pertinent Vitals/Pain Pain Assessment: Faces Faces Pain Scale: No hurt    Home Living Family/patient expects to be discharged to:: Private residence Living Arrangements: Children (70 yo son lives in basement apt) Available Help at Discharge: Family;Available 24 hours/day Type of Home: House Home Access: Stairs to enter Entrance Stairs-Rails: Right Entrance Stairs-Number of Steps: 1 (tall step to porch) Home Layout: Two level;Able to live on main level with bedroom/bathroom Home Equipment: Walker - 4 wheels;Cane - single point;Shower seat Additional Comments: Reports has a vanity near her toilet    Prior Function Level of Independence: Independent with assistive device(s)         Comments: Patient reports independence with meal prep and light housekeeping and use of SPC in home and community dwellings. Patient was sitting to shower.      Hand Dominance   Dominant Hand: Right    Extremity/Trunk Assessment   Upper Extremity Assessment Upper Extremity Assessment: Defer to OT evaluation    Lower Extremity Assessment Lower Extremity Assessment: Generalized weakness  Communication   Communication: No difficulties  Cognition Arousal/Alertness: Awake/alert Behavior During Therapy: WFL for tasks assessed/performed Overall Cognitive Status: Within Functional Limits for tasks assessed                                         General Comments General comments (skin integrity, edema, etc.):   04/21/20 0951  Vital Signs  Patient Position (if appropriate) Orthostatic Vitals  Orthostatic Sitting  BP- Sitting 143/87  Pulse- Sitting 77  Orthostatic Standing at 0 minutes  BP- Standing at 0 minutes 121/69  Pulse- Standing at 0 minutes 71  Orthostatic Standing at 3 minutes  BP- Standing at 3 minutes 130/68  Pulse- Standing at 3 minutes 80       Exercises     Assessment/Plan    PT Assessment Patient needs continued PT services  PT Problem List Decreased strength;Decreased activity tolerance;Decreased balance;Decreased mobility;Decreased knowledge of use of DME       PT Treatment Interventions DME instruction;Gait training;Stair training;Functional mobility training;Therapeutic activities;Therapeutic exercise;Patient/family education    PT Goals (Current goals can be found in the Care Plan section)  Acute Rehab PT Goals Patient Stated Goal: back to independence PT Goal Formulation: With patient Time For Goal Achievement: 05/05/20 Potential to Achieve Goals: Good    Frequency Min 3X/week   Barriers to discharge        Co-evaluation               AM-PAC PT "6 Clicks" Mobility  Outcome Measure Help needed turning from your back to your side while in a flat bed without using bedrails?: None Help needed moving from lying on your back to sitting on the side of a flat bed without using bedrails?: A Little Help needed moving to and from a bed to a chair (including a wheelchair)?: A Little Help needed standing up from a chair using your arms (e.g., wheelchair or bedside chair)?: A Little Help needed to walk in hospital room?: A Little Help needed climbing 3-5 steps with a railing? : A Little 6 Click Score: 19    End of Session Equipment Utilized During Treatment: Gait belt Activity Tolerance: Patient tolerated treatment well Patient left: in chair;with  call bell/phone within reach;with chair alarm set Nurse Communication: Mobility status PT Visit Diagnosis: Other abnormalities of gait and mobility (R26.89);Muscle weakness (generalized) (M62.81)    Time: 9485-4627 PT Time Calculation (min) (ACUTE ONLY): 29 min   Charges:   PT Evaluation $PT Eval Low Complexity: 1 Low PT Treatments $Gait Training: 8-22 mins        Van Clines, PT  Acute Rehabilitation Services Pager [phone] Office [phone]   Victoria Oconnell 04/21/2020, 2:47 PM

## 2020-04-21 NOTE — Progress Notes (Signed)
Nurse report called to 4east nurse lisa

## 2020-04-21 NOTE — Progress Notes (Signed)
Patient's NIHSS increased from 5 to 7 with worsening ataxia and dysarthria and confusion. Paged Neurology PA Laray Anger, PA).  No new orders, continue frequent neuro checks.

## 2020-04-21 NOTE — CV Procedure (Signed)
Echo attempted but Nurse came in to move patient to 4 east. Will do patient in am.

## 2020-04-21 NOTE — Progress Notes (Signed)
Patient's daughter called to inform us they have a document that the patient herself signed indicating that she would not want any heroic measures in a code situation. The daughter asked that we respect the patient's wishes and change her code status to DNR. They plan to bring a copy of the document when they visit tomorrow.

## 2020-04-21 NOTE — Consult Note (Addendum)
Neurology Consultation  Reason for Consult: Code stroke for confusion and aphasia Referring Physician: Dr. Hulan Saas, [facility] hospital[facility]  CC: Change in mentation, confusion, aphasia  History is obtained from: Chart, patient's family member Victoria Oconnell  HPI: Victoria Oconnell is a 86 y.o. female past medical history of atrial fibrillation not on anticoagulation due to bleeding being considered for a watchman device for stroke prevention, dementia which is mild, coronary artery disease, congestive heart failure, hypertension, being treated in the hospital for a GI bleed, had a sudden onset of change in mentation-speech became different than baseline. According to the patient's RN last known normal was 10:40 AM when she was with physical therapy and in her usual state of health. RN call button was activated and when they went to check on her, her speech sounded a little different.  She was not responding appropriately to questions. Rapid response was called.  Code stroke was initiated. Patient was not able to provide good history. Victoria Oconnell, the family member, provided history that they were concerned for her high risk of stroke from atrial fibrillation and not being on anticoagulation due to GI bleeds and that is why she was being considered for watchman device.  LKW: 10:40 AM on 04/21/2020 tpa given?: no, active GI bleed with recent transfusion 2 days ago Premorbid modified Rankin scale (mRS): 1 (some walking assistance, lived alone prior to three weeks with all the bleeding related issues, was driving)  ROS: Unable to obtain reliably due to mental status  Past Medical History:  Diagnosis Date  . Asthma   . Atrial fibrillation (HCC)   . CAD (coronary artery disease)    has stent  . CHF (congestive heart failure) (HCC)   . Dementia (HCC)    mild  . Dependence on nocturnal oxygen therapy    uncertain etiology  . Diverticulosis   . Essential hypertension   . GI bleeding 04/19/2020     History reviewed. No pertinent family history.   Social History:   reports that she has never smoked. She has never used smokeless tobacco. She reports that she does not drink alcohol and does not use drugs.  Medications  Current Facility-Administered Medications:  .  acetaminophen (TYLENOL) tablet 650 mg, 650 mg, Oral, Q6H PRN, 650 mg at 04/21/20 0307 **OR** acetaminophen (TYLENOL) suppository 650 mg, 650 mg, Rectal, Q6H PRN, Jonah Blue, MD .  albuterol (VENTOLIN HFA) 108 (90 Base) MCG/ACT inhaler 2 puff, 2 puff, Inhalation, Q4H PRN, Jonah Blue, MD .  bisoprolol (ZEBETA) tablet 5 mg, 5 mg, Oral, Daily, Vann, Jessica U, DO .  digoxin (LANOXIN) tablet 0.125 mg, 0.125 mg, Oral, Daily, Vann, Jessica U, DO .  FLUoxetine (PROZAC) capsule 40 mg, 40 mg, Oral, Daily, Jonah Blue, MD, 40 mg at 04/08/2020 1813 .  hydrALAZINE (APRESOLINE) injection 5 mg, 5 mg, Intravenous, Q4H PRN, Jonah Blue, MD, 5 mg at 04/08/2020 1052 .  levothyroxine (SYNTHROID) tablet 88 mcg, 88 mcg, Oral, QAC breakfast, Jonah Blue, MD, 88 mcg at 04/21/20 [phone] .  mometasone-formoterol (DULERA) 200-5 MCG/ACT inhaler 2 puff, 2 puff, Inhalation, BID, Jonah Blue, MD, 2 puff at 04/21/20 0815 .  ondansetron (ZOFRAN) tablet 4 mg, 4 mg, Oral, Q6H PRN **OR** ondansetron (ZOFRAN) injection 4 mg, 4 mg, Intravenous, Q6H PRN, Jonah Blue, MD .  pantoprazole (PROTONIX) injection 40 mg, 40 mg, Intravenous, Q24H, Jonah Blue, MD, 40 mg at 04/21/20 0856 .  pravastatin (PRAVACHOL) tablet 40 mg, 40 mg, Oral, QHS, Jonah Blue, MD, 40 mg at 04/24/2020  2233 .  simethicone (MYLICON) chewable tablet 80 mg, 80 mg, Oral, Q6H PRN, Jonah Blue, MD   Exam: Current vital signs: BP (!) 154/91   Pulse 94   Temp 98.2 F (36.8 C) (Oral)   Resp 18   Ht 5\' 5"  (1.651 m)   Wt 61.2 kg   SpO2 97%   BMI 22.47 kg/m  Vital signs in last 24 hours: Temp:  [98 F (36.7 C)-98.6 F (37 C)] 98.2 F (36.8 C) (09/26  0502) Pulse Rate:  [60-94] 94 (09/26 1121) Resp:  [16-18] 18 (09/26 0502) BP: (136-157)/(72-91) 154/91 (09/26 1121) SpO2:  [93 %-97 %] 97 % (09/26 1121) Awake alert in no distress Normocephalic atraumatic Lungs clear to auscultation Cardiovascular: Irregularly irregular Abdomen soft nondistended nontender Extremities warm well perfused Neurological exam Mental status: She is awake alert oriented to self. Her speech is clear but she is not able to repeat sentences.  She responds inappropriately to questions-on asking her what is her name, her response was I am doing well honey.  She does not sound dysarthric. 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  She is able to mimic through all this-when asked to show thumbs up verbally, she could not but when I put my own thumbs up, she was able to put her thumbs up. Cranial nerves: Pupils equal round react light, extraocular movements intact, visual fields full, I did not appreciate a visual field cut, face appears symmetric, tongue and palate midline. Motor examination: No drift on upper or lower extremities bilaterally Sensory exam intact to touch Coordination: No obvious dysmetria NIH stroke scale-3 (1 for aphasia, 2 for LOC questions)  Labs I have reviewed labs in epic and the results pertinent to this consultation are:  CBC    Component Value Date/Time   WBC 7.5 04/21/2020 0530   RBC 3.12 (L) 04/21/2020 0530   HGB 9.0 (L) 04/21/2020 0530   HCT 28.4 (L) 04/21/2020 0530   PLT 346 04/21/2020 0530   MCV 91.0 04/21/2020 0530   MCH 28.8 04/21/2020 0530   MCHC 31.7 04/21/2020 0530   RDW 15.9 (H) 04/21/2020 0530    CMP     Component Value Date/Time   NA 133 (L) 04/21/2020 0530   K 3.8 04/21/2020 0530   CL 102 04/21/2020 0530   CO2 25 04/21/2020 0530   GLUCOSE 95 04/21/2020 0530   BUN 10 04/21/2020 0530   CREATININE 0.95 04/21/2020 0530   CALCIUM 8.4 (L) 04/21/2020 0530   GFRNONAA 54 (L) 04/21/2020 0530   GFRAA >60 04/21/2020 0530    Imaging I have reviewed the images obtained:  CT-scan of the brain-no acute changes.  Aspects 10.  Vessel imaging not performed-iodinated diagnostic agent allergy listed on chart requiring long prep.  Assessment: 86 year old with above past medical history that most pertinently includes atrial fibrillation not on anticoagulation due to bleeding side effects, currently in the hospital for management of GI bleed, with sudden onset of speech difficulties. Examination shows receptive aphasia with ability to mimic. I suspect a small embolic stroke on the left hemisphere given her clinical picture and my examination. Not a candidate for TPA due to active GI bleed-required blood transfusion 2 days ago. Not a candidate for emergent mechanical thrombectomy due to clinical exam not consistent with an LVO although she does have aphasia which I suspect is from a distal vessel involvement.  Unable to obtain CT angiogram due to her dye allergy.  Recommending stat MRI.  Impression: Evaluate for acute ischemic stroke-likely  cardioembolic given history of A. fib. Not a candidate for TPA due to active GI bleeding.  Not a candidate for EVT due to low stroke scale as well as  Recommendations: Stat MRI.  Further recommendations to follow. -- Milon Dikes, MD [facility] Pager: [phone] If 7pm to 7am, please call on call as listed on AMION.   Addendum MRI completed-shows few area of restricted diffusion in the left hemisphere, both in the centrum semiovale as well as posterior frontal gyrus cortically-acute to subacute infarct-some possible T2 shine through effect as well. I would suspect these are cardioembolic strokes given her history of A. fib and no anticoagulation. MRA head and neck with left superior and inferior division M2 filling defects which also might be a result of cardioembolic clots as she has no clear evidence of atherosclerosis proximally. Unfortunately she is not a  candidate for anticoagulation due to active GI bleeding. Low NIH stroke scale and distal stenoses/occlusions for EVT.  Other differentials to consider possible seizures-no history of seizures but can obtain EEG.  Recommendations: She will need stroke risk factor work-up although I think her etiology is cardioembolic.  Carotid Dopplers Frequent neurochecks The ideal stroke prevention strategy for her will be anticoagulation but that is not possible as described above. Check echocardiogram Check A1c and lipid panel PT OT Speech therapy N.p.o. until cleared by bedside swallow evaluation Continue on telemetry Blood pressures: Allow for permissive hypertension for 48 to 72 hours-do not treat unless systolic is greater than 220 and if need be to do that, only do it on a as needed basis. EEG if EEG shows any abnormality in the areas of her restricted diffusion is seen, can consider restarting AED with the thought process that the MRI changes might be postictal/seizure related.  Stroke team will follow with you.  -- Milon Dikes, MD [facility] Pager: [phone] If 7pm to 7am, please call on call as listed on AMION.  CRITICAL CARE ATTESTATION Performed by: Milon Dikes, MD Total critical care time: 55 minutes Critical care time was exclusive of separately billable procedures and treating other patients and/or supervising APPs/Residents/Students Critical care was necessary to treat or prevent imminent or life-threatening deterioration due to acute ischemic stroke. This patient is critically ill and at significant risk for neurological worsening and/or death and care requires constant monitoring. Critical care was time spent personally by me on the following activities: development of treatment plan with patient and/or surrogate as well as nursing, discussions with consultants, evaluation of patient's response to treatment, examination of patient, obtaining history from patient  or surrogate, ordering and performing treatments and interventions, ordering and review of laboratory studies, ordering and review of radiographic studies, pulse oximetry, re-evaluation of patient's condition, participation in multidisciplinary rounds and medical decision making of high complexity in the care of this patient.

## 2020-04-22 ENCOUNTER — Encounter (HOSPITAL_COMMUNITY): Payer: Self-pay | Admitting: Gastroenterology

## 2020-04-22 ENCOUNTER — Inpatient Hospital Stay (HOSPITAL_COMMUNITY): Payer: Medicare Other

## 2020-04-22 DIAGNOSIS — I6340 Cerebral infarction due to embolism of unspecified cerebral artery: Secondary | ICD-10-CM | POA: Insufficient documentation

## 2020-04-22 DIAGNOSIS — R4182 Altered mental status, unspecified: Secondary | ICD-10-CM

## 2020-04-22 LAB — CBC
HCT: 30.8 % — ABNORMAL LOW (ref 36.0–46.0)
Hemoglobin: 9.6 g/dL — ABNORMAL LOW (ref 12.0–15.0)
MCH: 27.8 pg (ref 26.0–34.0)
MCHC: 31.2 g/dL (ref 30.0–36.0)
MCV: 89.3 fL (ref 80.0–100.0)
Platelets: 369 10*3/uL (ref 150–400)
RBC: 3.45 MIL/uL — ABNORMAL LOW (ref 3.87–5.11)
RDW: 15.6 % — ABNORMAL HIGH (ref 11.5–15.5)
WBC: 7.8 10*3/uL (ref 4.0–10.5)
nRBC: 0 % (ref 0.0–0.2)

## 2020-04-22 LAB — BASIC METABOLIC PANEL
Anion gap: 7 (ref 5–15)
BUN: 10 mg/dL (ref 8–23)
CO2: 24 mmol/L (ref 22–32)
Calcium: 8.6 mg/dL — ABNORMAL LOW (ref 8.9–10.3)
Chloride: 102 mmol/L (ref 98–111)
Creatinine, Ser: 0.97 mg/dL (ref 0.44–1.00)
GFR calc Af Amer: >60 mL/min (ref >60)
GFR calc non Af Amer: 53 mL/min — ABNORMAL LOW (ref >60)
Glucose, Bld: 95 mg/dL (ref 70–99)
Potassium: 4.2 mmol/L (ref 3.5–5.1)
Sodium: 133 mmol/L — ABNORMAL LOW (ref 135–145)

## 2020-04-22 IMAGING — MR MR HEAD W/O CM
5 series · 48 of 48 positions shown · non-contrast
Comparison: Brain MRI [DATE]

CLINICAL DATA: Stroke follow-up.  Memory loss.

EXAM:
MRI HEAD WITHOUT CONTRAST
TECHNIQUE: Multiplanar, multiecho pulse sequences of the brain and surrounding
structures were obtained without intravenous contrast.

[Series 5: DWI · axial · 3.0mm · 0.88mm/px · z∈[-141,+13]mm · 18 of 108 slices shown (1 of 4)]
[im 1/108]
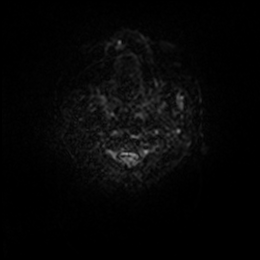
[im 7/108]
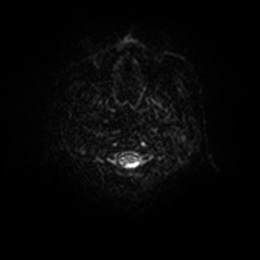
[im 13/108]
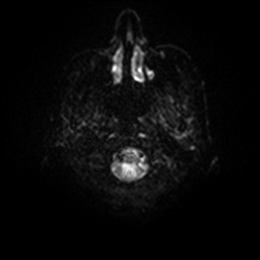
[im 19/108]
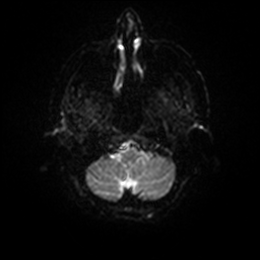
[im 26/108]
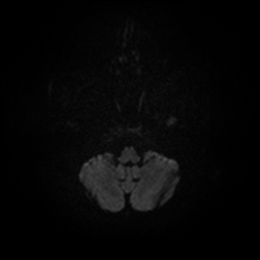
[im 32/108]
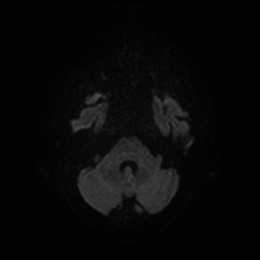
[im 38/108]
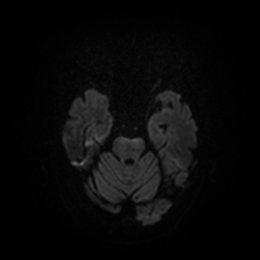
[im 45/108]
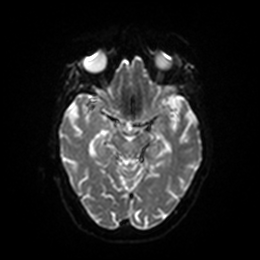
[im 51/108]
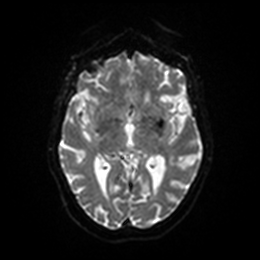
[im 57/108]
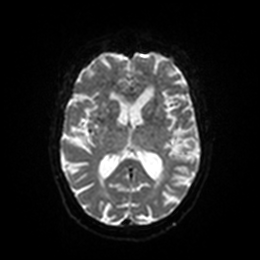
[im 63/108]
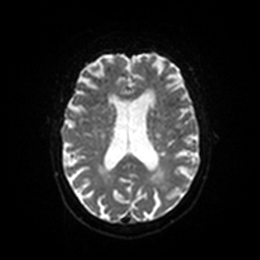
[im 70/108]
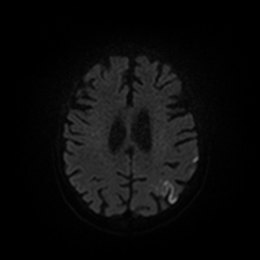
[im 76/108]
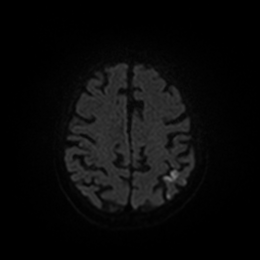
[im 82/108]
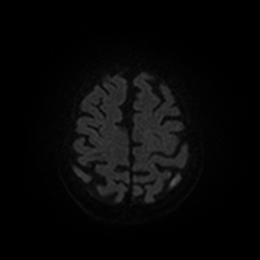
[im 89/108]
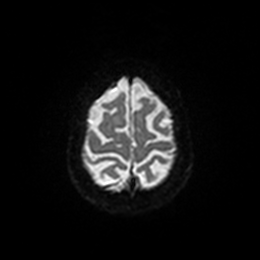
[im 95/108]
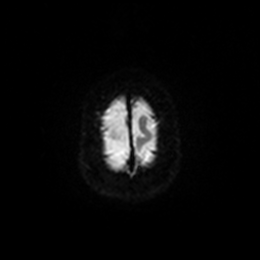
[im 101/108]
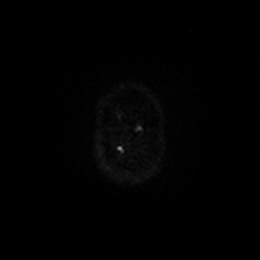
[im 108/108]
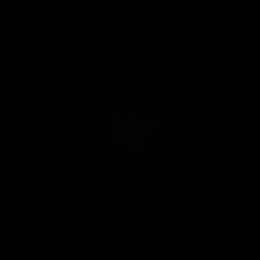

[Series 6: DWI · axial · 3.0mm · 0.88mm/px · z∈[-141,+10]mm · 8 of 53 slices shown (2 of 4)]
[im 1/53]
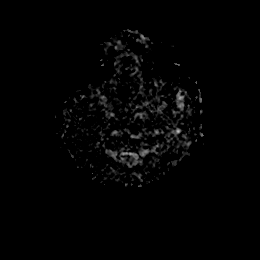
[im 8/53]
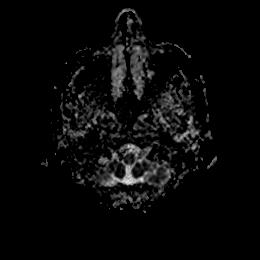
[im 15/53]
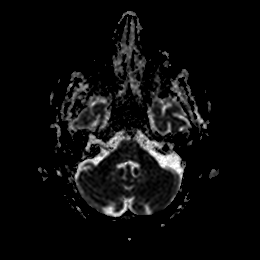
[im 23/53]
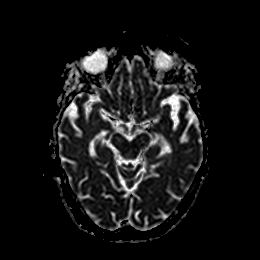
[im 30/53]
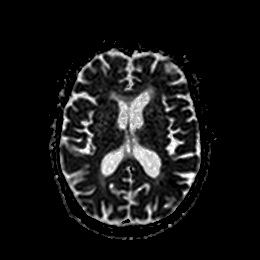
[im 38/53]
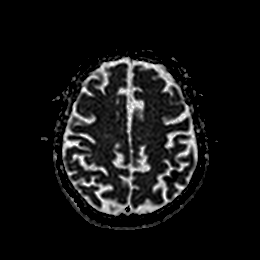
[im 45/53]
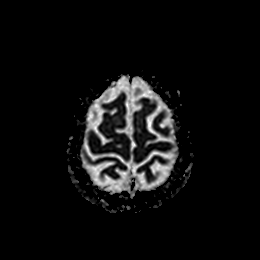
[im 53/53]
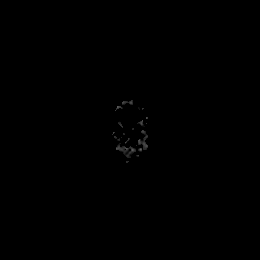

[Series 7: DWI · coronal · 4.0mm · 0.88mm/px · 12 of 76 slices shown (3 of 4)]
[im 1/76]
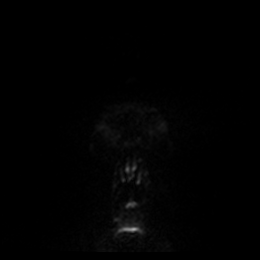
[im 7/76]
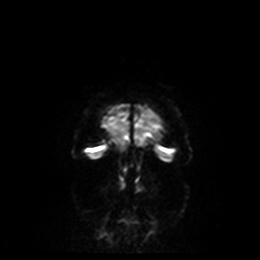
[im 14/76]
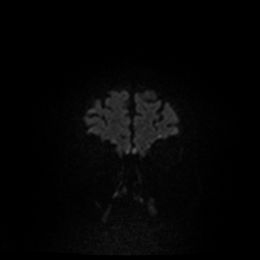
[im 21/76]
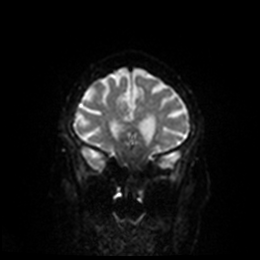
[im 28/76]
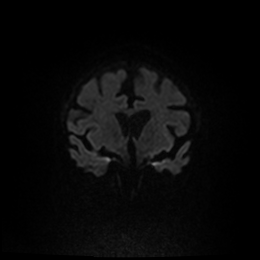
[im 35/76]
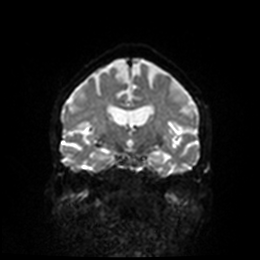
[im 41/76]
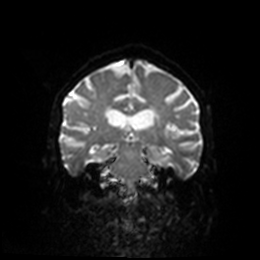
[im 48/76]
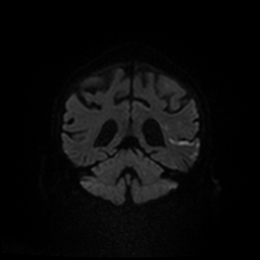
[im 55/76]
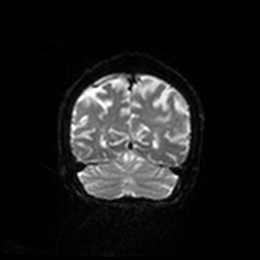
[im 62/76]
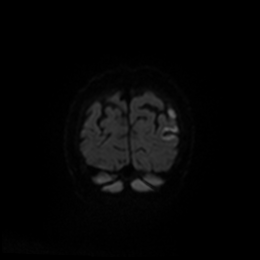
[im 69/76]
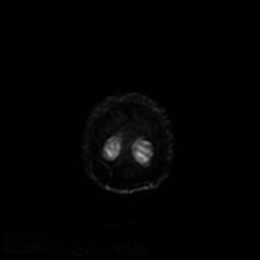
[im 76/76]
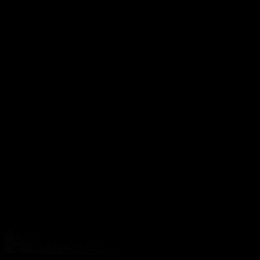

[Series 8: DWI · coronal · 4.0mm · 0.88mm/px · 6 of 37 slices shown (4 of 4)]
[im 1/37]
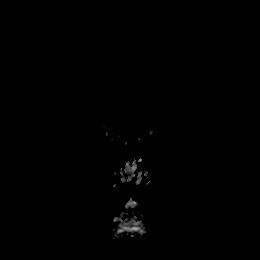
[im 8/37]
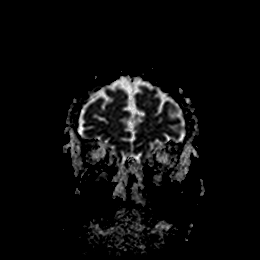
[im 15/37]
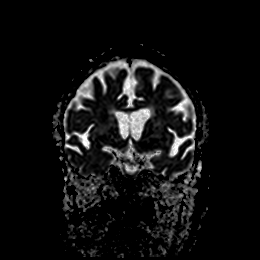
[im 22/37]
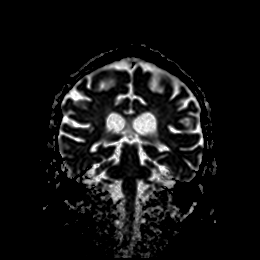
[im 29/37]
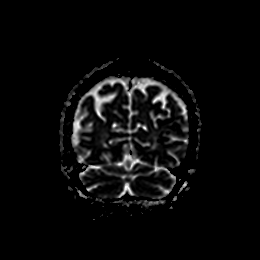
[im 37/37]
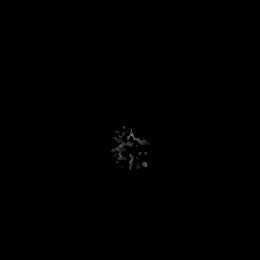

[Series 9: FLAIR · axial · 5.0mm · 0.45mm/px · z∈[-137,+14]mm · 4 of 27 slices shown]
[im 1/27]
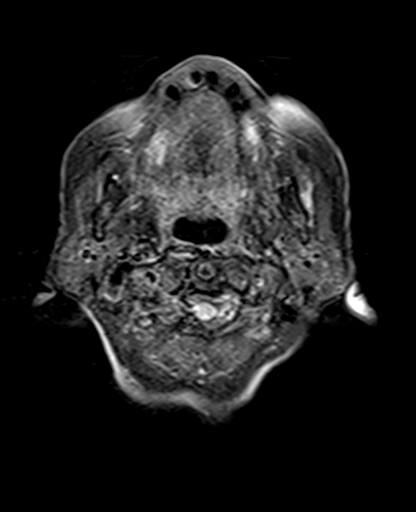
[im 9/27]
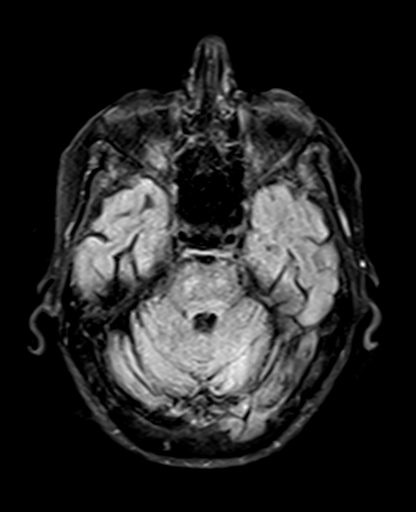
[im 18/27]
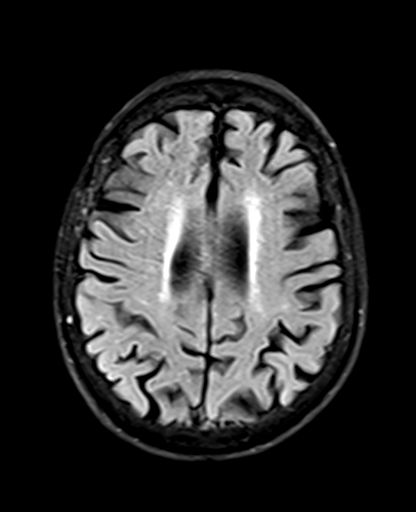
[im 27/27]
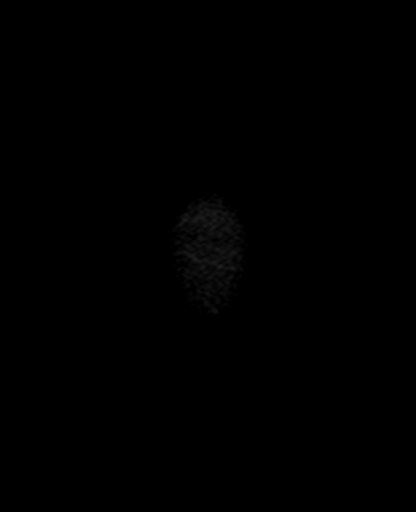

[48 of 48 positions shown; findings below may reference images not displayed]

FINDINGS: An abbreviated protocol was performed including axial and coronal
diffusion-weighted imaging and axial T2 FLAIR sequence.

There is a new area of abnormal diffusion restriction primarily
within the left parietal lobe and mostly cortical/subcortical. No
hemorrhage or mass effect. Unchanged moderate periventricular white
matter hyperintensity.
IMPRESSION: New area of acute/subacute cortical/subcortical ischemia
predominantly within the left parietal lobe.

## 2020-04-22 IMAGING — DX DG ABDOMEN 1V
2 series · 2 of 2 positions shown · non-contrast
Comparison: None.

CLINICAL DATA: Possible retained and ostomy capsule

EXAM:
ABDOMEN - 1 VIEW

[abdomen kub (1 of 2)]
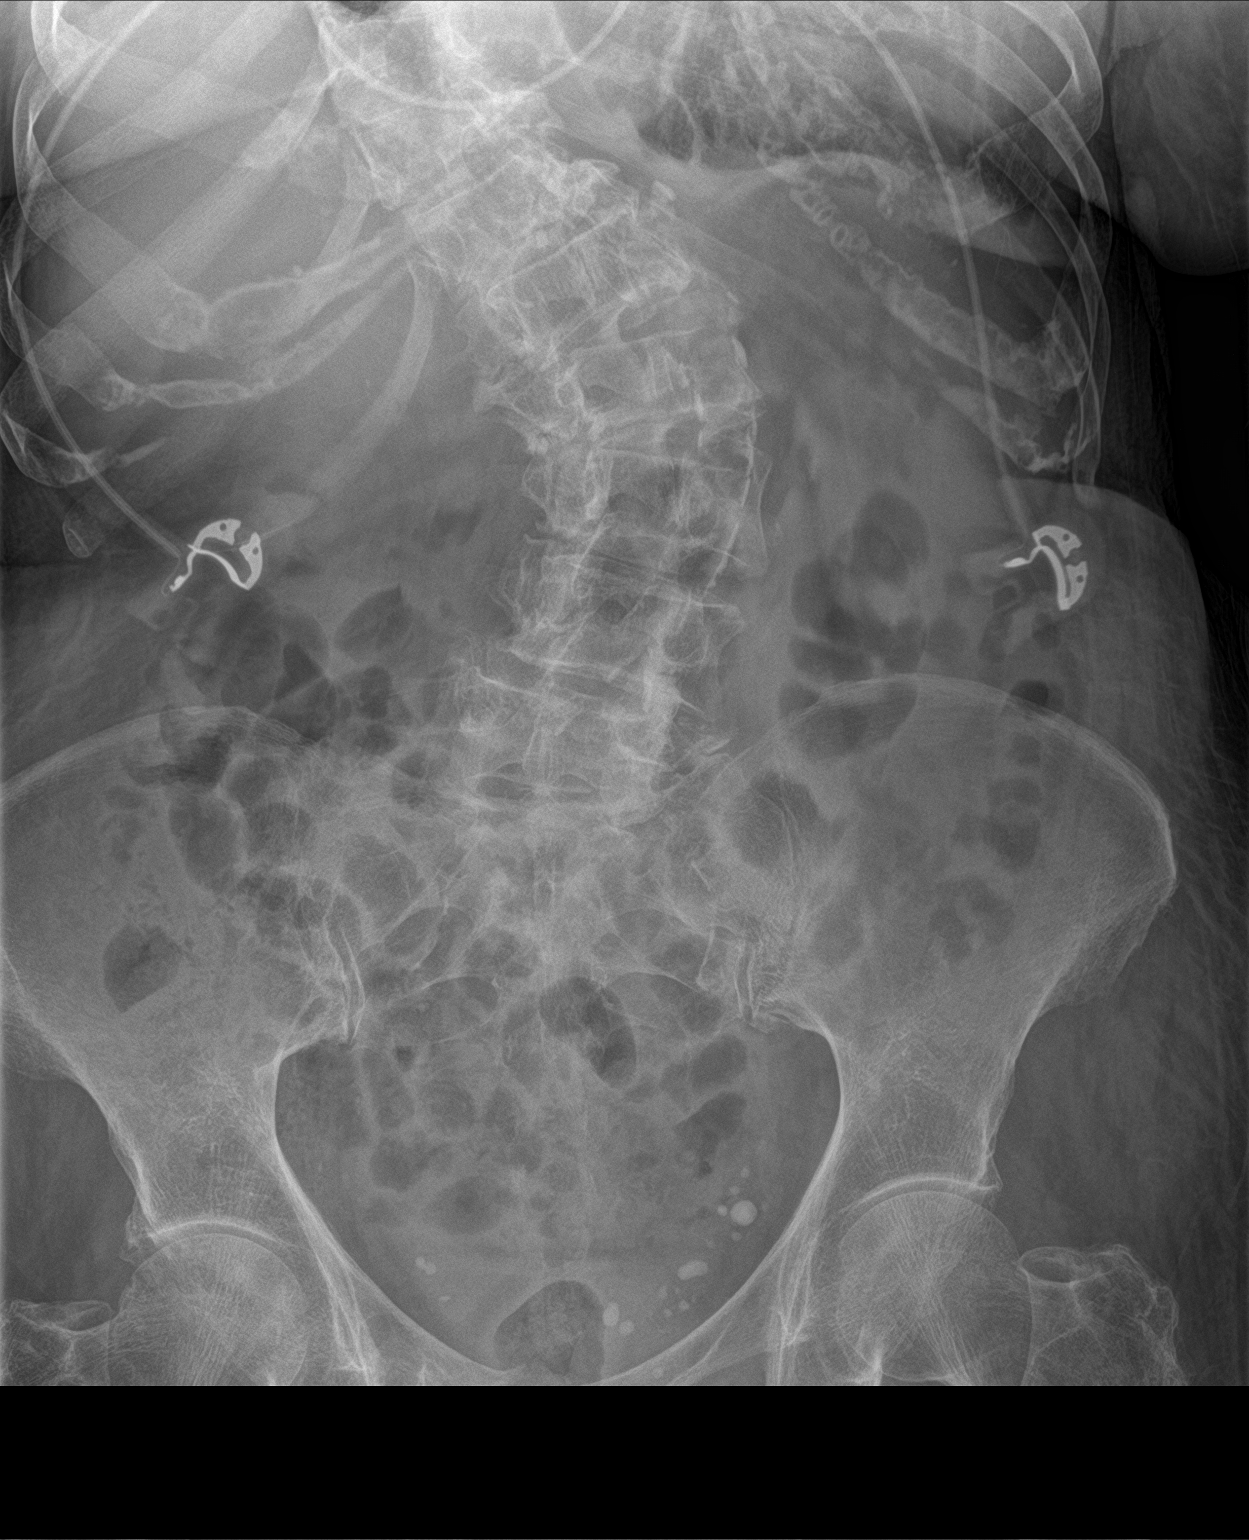

[abdomen kub (2 of 2)]
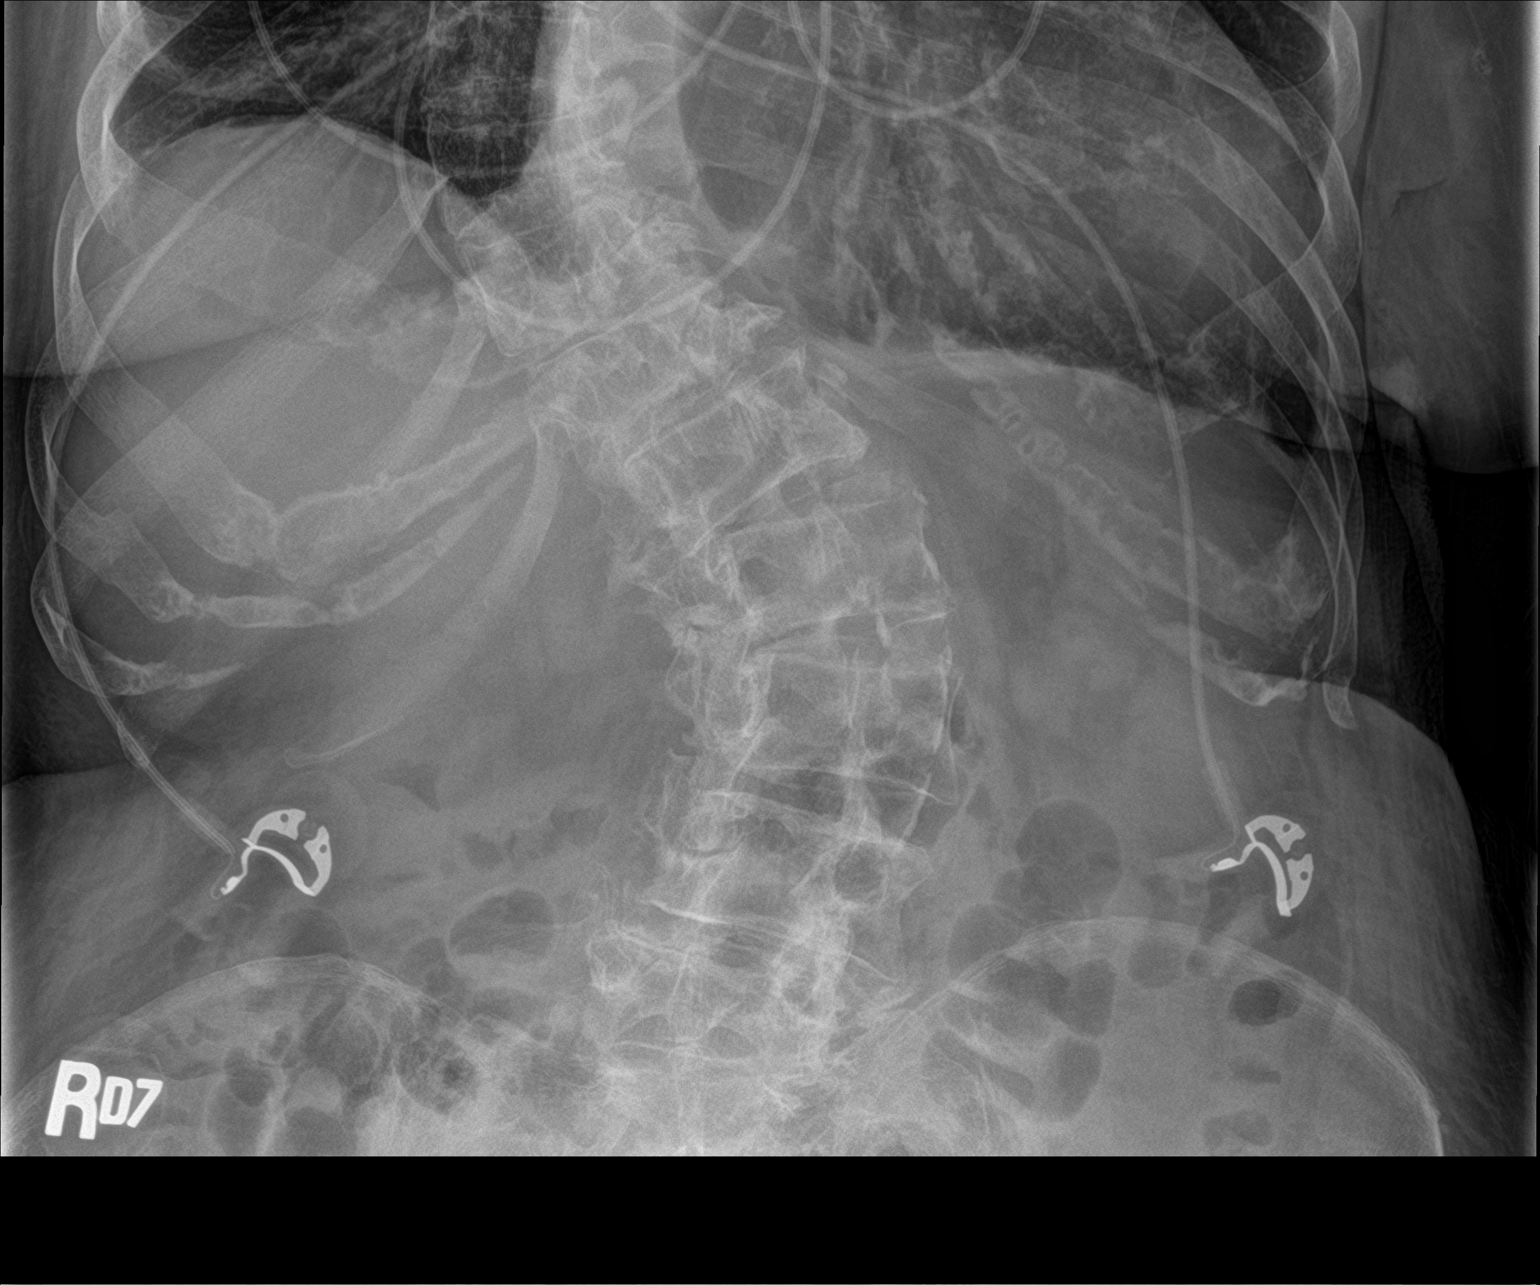

[2 of 2 positions shown; findings below may reference images not displayed]

FINDINGS: Scattered large and small bowel gas is noted. No abnormal mass or
abnormal calcifications are seen. No radiopaque foreign body is
noted. Changes of scoliosis in the thoracolumbar spine and multiple
pubic rami fractures are noted.
IMPRESSION: No evidence of retained foreign body.

## 2020-04-22 MED ORDER — SODIUM CHLORIDE 0.9% FLUSH
10.0000 mL | INTRAVENOUS | Status: DC | PRN
  Administered 2020-04-22 (×2): 10 mL via INTRAVENOUS

## 2020-04-22 MED ORDER — FLUOXETINE HCL 20 MG PO CAPS
20.0000 mg | ORAL_CAPSULE | Freq: Every day | ORAL | Status: DC
  Administered 2020-04-23: 20 mg via ORAL
  Filled 2020-04-22: qty 1

## 2020-04-22 NOTE — Progress Notes (Signed)
 Gastroenterology Progress Note    Since last GI note: EGD and colonoscopy 9/25 with Dr. Elnoria Howard showed no active bleeding and no clear etiology of her recent bleeding.  Capsule endoscopy report (per signout with Dr. Elnoria Howard last night), showed no obvious etiology of her bleeding either.  Awating final report reading.  I see she suffered stroke like symptoms last night.  This morning she is up on a comode.  Feels well, answering questions normally.  Really alert and oriented times 3.  Objective: Vital signs in last 24 hours: Temp:  [97.8 F (36.6 C)-99.4 F (37.4 C)] 98.5 F (36.9 C) (09/27 0742) Pulse Rate:  [72-94] 79 (09/27 0841) Resp:  [16-20] 18 (09/27 0841) BP: (124-187)/(80-104) 124/104 (09/27 0742) SpO2:  [93 %-99 %] 97 % (09/27 0841) Last BM Date: 04/21/20 General: alert and oriented times 3 Heart: regular rate and rythm Abdomen: soft, non-tender, non-distended, normal bowel sounds   Lab Results: Recent Labs    04/21/2020 1641 04/21/20 0530 04/22/20 0321  WBC 8.4 7.5 7.8  HGB 9.7* 9.0* 9.6*  PLT 325 346 369  MCV 89.9 91.0 89.3   Recent Labs    04/19/2020 0633 04/21/20 0530 04/22/20 0321  NA 139 133* 133*  K 4.2 3.8 4.2  CL 105 102 102  CO2 27 25 24   GLUCOSE 89 95 95  BUN 13 10 10   CREATININE 1.07* 0.95 0.97  CALCIUM 8.8* 8.4* 8.6*    Medications: Scheduled Meds: . bisoprolol  5 mg Oral Daily  . digoxin  0.125 mg Oral Daily  . FLUoxetine  40 mg Oral Daily  . levothyroxine  88 mcg Oral QAC breakfast  . mometasone-formoterol  2 puff Inhalation BID  . pantoprazole (PROTONIX) IV  40 mg Intravenous Q24H  . pravastatin  40 mg Oral QHS   Continuous Infusions: . sodium chloride 20 mL/hr at 04/22/20 0010   PRN Meds:.acetaminophen **OR** acetaminophen, albuterol, hydrALAZINE, ondansetron **OR** ondansetron (ZOFRAN) IV, simethicone   Assessment/Plan: 86 y.o. female who presented with recurrent overt, obscure bleeding.  Workup for this 2-3 weeks ago  in [place] with colonoscopy and EGD (possibly two colonoscopies) was unrevealing. Repeat workup here over the weekend with colonoscopy, upper enteroscopy and then capsule endo showed no clear etiology of her bleeding.  Awaiting final capsule reading.  The risk of not resuming her anticoagulation is further neurologic decline.  I would not recommend more GI testing for now.  Should consider not resuming blood thinner after full discussion with patient and family.   We will continue to follow, if signfiicant rebleeding occurs then likely a nuc-med bleeding scan or CTAngio will be helpful but these tests are only useful during active bleeding which I don't think is going on right now.    97, MD  04/22/2020, 10:05 AM  Gastroenterology Pager [phone]

## 2020-04-22 NOTE — Progress Notes (Signed)
EEG completed, results pending. 

## 2020-04-22 NOTE — Progress Notes (Signed)
  Echocardiogram 2D Echocardiogram has been performed.  Augustine Radar 04/22/2020, 3:11 PM

## 2020-04-22 NOTE — Progress Notes (Signed)
Progress Note    Victoria Oconnell  UVO:[id number] DOB: 07-24-1930  DOA: 04/08/2020 PCP: Alben Deeds, MD    Brief Narrative:     Medical records reviewed and are as summarized below:  Victoria Oconnell is an 86 y.o. female with medical history significant of afib, previously on Xarelto multiple recent hospitalizations for GI bleeds presenting with the same; Eliquis was stopped 1 week ago for this reason.  She has been having bloody stools.  She has had 2 recent hospital admissions in [place] - 3 weeks ago with scope, on Xarelto for afib, no source identified; she began bleeding again at home and so readmitted and Hgb was 4.9.  She had been inadvertently taking Xarelto.  Scoped twice, found a polyp and diverticulosis.  Were considering a Watchman due to afib and considering a laparoscopic procedure.  Her DIL works for EP here and so they were working on that.  She was discharged on Saturday of last weekend and followed up with PCP.  Hgb was 9.5 at d/c and above 8 Tuesday at PCP f/u.  Last night, she felt weak, tired, BP was 93/62.  She felt felt lightheaded.  She missed her wheelchair during transfer and so they decided to bring her in to [facility].  Initially had EGD and then had 3 subsequent colonoscopies.  She is off the Xarelto due to GI bleeding   Assessment/Plan:   Principal Problem:   Acute GI bleeding Active Problems:   Essential hypertension   Diverticulosis   CHF (congestive heart failure) (HCC)   CAD (coronary artery disease)   Atrial fibrillation (HCC)   Asthma   Dependence on nocturnal oxygen therapy   Dementia (HCC)   Anemia due to blood loss   Cerebral embolism with cerebral infarction   New embolic CVAs -patient had an abrupt mental status change on 9/26 around 11 AM -Patient called out stating that she could not see -Patient was also having receptive aphasia -Code stroke called Dr. Bess Harvest help is appreciated -MRI shows acute/subacute insults involving the left frontal  cortex and left periventricular white matter -discussed with neuro and her presentation seems out of proportion to the MRI findings, patient getting repeat MRI today  Neurologic changes -unclear -clarifying with pharmacy/family is patient was actually taking prozac as when I called [facility] I was told it was on hold and patient had not started (20 mg) -EEG with mild diffuse encephalopathy  GI Bleeding with ABLA -Patient with 2 prior recent hospitalizations with reported EGD x 1 and colonoscopy x 3 showing diverticulosis -She was previously on Xarelto but this has been stopped; her last dose was over a week ago and possibly 2 weeks ago -s/p 2 units PRBC -Continue to monitor for recurrent bleeding -GI consult appreciated: s/p colonoscopy:  Diverticulosis in the sigmoid colon, no source of bleeding found.   -Capsule endoscopy was also unrevealing site of bleeding  Afib -Patient with long-standing afib -reported to be bradycardic-we will continue telemetry -Xarelto has been on hold since about 9/11 and due to bleeding, she is no longer a candidate for  -Resume dig: resume bisoprolol at a lower dose  Asthma, on nocturnal O2 -Continue Albuterol -She has been reportedly taking both Symbicort and Flovent, which appears to be redundant -Will order Dulera now (formulary substitution for Symbicort) -Continue nocturnal O2 (although it is not entirely clear why she needs this at this time)  Dementia -It is not clear how much this has been discussed with patient/family, although her daughter-in-law does  acknowledge mild cognitive impairment  Hypothyroidism -Continue Synthroid at current dose for now -outpatient follow up  HTN -Hold Cozaar for now  Renal dysfunction --monitor  CAD -Hold ASA for now, likely need to resume soon if possible -Continue Pravachol -She has prior remote stent  CHF -Uncertain whether systolic or diastolic since no records are available -Takes  ACE/BB/Lasix at home -Appears to be compensated at this time -resume lasix as able     Family Communication/Anticipated D/C date and plan/Code Status   DVT prophylaxis: scd Code Status: DNR per family Disposition Plan: Status is: Inpatient  Remains inpatient appropriate because:Inpatient level of care appropriate due to severity of illness   Dispo: The patient is from: Home              Anticipated d/c is to: SNF              Anticipated d/c date is: 2 days              Patient currently is not medically stable to d/c.  Needs stroke work-up         Medical Consultants:   GI neurology    Subjective:   Continues to have waxing and waning mental status-- word finding difficulty at times, inappropriate answers to questions at times-- was able to tell EEG tech what year it was   Objective:    Vitals:   04/22/20 0349 04/22/20 0742 04/22/20 0841 04/22/20 1122  BP: (!) 161/87 (!) 124/104  (!) 152/85  Pulse: 92 87 79 80  Resp: Temp: 98.2 F (36.8 C) 98.5 F (36.9 C)  98.3 F (36.8 C)  TempSrc: Oral Oral  Oral  SpO2: 95% 96% 97% 96%  Weight:      Height:        Intake/Output Summary (Last 24 hours) at 04/22/2020 1557 Last data filed at 04/22/2020 1500 Gross per 24 hour  Intake 415.16 ml  Output --  Net 415.16 ml   Filed Weights   04/09/2020 0709  Weight: 61.2 kg    Exam:   General: Appearance:    elderly female in no acute distress     Lungs:      respirations unlabored  Heart:    Normal heart rate. No murmurs, rubs, or gallops.   MS:   All extremities are intact.   Neurologic:   Awake, EEG hooked up     Data Reviewed:   I have personally reviewed following labs and imaging studies:  Labs: Labs show the following:   Basic Metabolic Panel: Recent Labs  Lab 04/19/20 0000 04/19/20 0000 04/08/2020 0633 04/21/2020 0633 04/21/20 0530 04/22/20 0321  NA 132*  --  139  --  133* 133*  K 4.7   < > 4.2   < > 3.8 4.2  CL 95*  --  105   --  102 102  CO2 27  --  27  --  25 24  GLUCOSE 119*  --  89  --  95 95  BUN 21  --  13  --  10 10  CREATININE 1.25*  --  1.07*  --  0.95 0.97  CALCIUM 9.1  --  8.8*  --  8.4* 8.6*   < > = values in this interval not displayed.   GFR Estimated Creatinine Clearance: 36.8 mL/min (by C-G formula based on SCr of 0.97 mg/dL). Liver Function Tests: No results for input(s): AST, ALT, ALKPHOS, BILITOT, PROT, ALBUMIN in the  last 168 hours. No results for input(s): LIPASE, AMYLASE in the last 168 hours. No results for input(s): AMMONIA in the last 168 hours. Coagulation profile Recent Labs  Lab 04/19/20 0000  INR 1.0    CBC: Recent Labs  Lab 04/19/20 1649 10-05-2019 0633 10-05-2019 1641 04/21/20 0530 04/22/20 0321  WBC 6.9 6.7 8.4 7.5 7.8  HGB 9.9* 10.0* 9.7* 9.0* 9.6*  HCT 30.6* 31.8* 31.3* 28.4* 30.8*  MCV 89.5 89.6 89.9 91.0 89.3  PLT 336 323 325 346 369   Cardiac Enzymes: No results for input(s): CKTOTAL, CKMB, CKMBINDEX, TROPONINI in the last 168 hours. BNP (last 3 results) No results for input(s): PROBNP in the last 8760 hours. CBG: Recent Labs  Lab 04/21/20 1122  GLUCAP 100*   D-Dimer: No results for input(s): DDIMER in the last 72 hours. Hgb A1c: Recent Labs    04/21/20 0530  HGBA1C 5.3   Lipid Profile: Recent Labs    04/21/20 0530  CHOL 105  HDL 39*  LDLCALC 54  TRIG 58  CHOLHDL 2.7   Thyroid function studies: Recent Labs    04/19/20 1649  TSH 6.325*   Anemia work up: No results for input(s): VITAMINB12, FOLATE, FERRITIN, TIBC, IRON, RETICCTPCT in the last 72 hours. Sepsis Labs: Recent Labs  Lab 10-05-2019 [id number] 10-05-2019 1641 04/21/20 0530 04/22/20 0321  WBC 6.7 8.4 7.5 7.8    Microbiology Recent Results (from the past 240 hour(s))  Respiratory Panel by RT PCR (Flu A&B, Covid) - Nasopharyngeal Swab     Status: None   Collection Time: 04/19/20  6:33 AM   Specimen: Nasopharyngeal Swab  Result Value Ref Range Status   SARS Coronavirus 2 by  RT PCR NEGATIVE NEGATIVE Final    Comment: (NOTE) SARS-CoV-2 target nucleic acids are NOT DETECTED.  The SARS-CoV-2 RNA is generally detectable in upper respiratoy specimens during the acute phase of infection. The lowest concentration of SARS-CoV-2 viral copies this assay can detect is 131 copies/mL. A negative result does not preclude SARS-Cov-2 infection and should not be used as the sole basis for treatment or other patient management decisions. A negative result may occur with  improper specimen collection/handling, submission of specimen other than nasopharyngeal swab, presence of viral mutation(s) within the areas targeted by this assay, and inadequate number of viral copies (<131 copies/mL). A negative result must be combined with clinical observations, patient history, and epidemiological information. The expected result is Negative.  Fact Sheet for Patients:  https://www.moore.com/https://www.fda.gov/media/142436/download  Fact Sheet for Healthcare Providers:  https://www.young.biz/https://www.fda.gov/media/142435/download  This test is no t yet approved or cleared by the [place] FDA and  has been authorized for detection and/or diagnosis of SARS-CoV-2 by FDA under an Emergency Use Authorization (EUA). This EUA will remain  in effect (meaning this test can be used) for the duration of the COVID-19 declaration under Section 564(b)(1) of the Act, 21 U.S.C. section 360bbb-3(b)(1), unless the authorization is terminated or revoked sooner.     Influenza A by PCR NEGATIVE NEGATIVE Final   Influenza B by PCR NEGATIVE NEGATIVE Final    Comment: (NOTE) The Xpert Xpress SARS-CoV-2/FLU/RSV assay is intended as an aid in  the diagnosis of influenza from Nasopharyngeal swab specimens and  should not be used as a sole basis for treatment. Nasal washings and  aspirates are unacceptable for Xpert Xpress SARS-CoV-2/FLU/RSV  testing.  Fact Sheet for Patients: https://www.moore.com/https://www.fda.gov/media/142436/download  Fact Sheet for  Healthcare Providers: https://www.young.biz/https://www.fda.gov/media/142435/download  This test is not yet approved or cleared by the [place] FDA and  has been authorized for detection and/or diagnosis of SARS-CoV-2 by  FDA under an Emergency Use Authorization (EUA). This EUA will remain  in effect (meaning this test can be used) for the duration of the  Covid-19 declaration under Section 564(b)(1) of the Act, 21  U.S.C. section 360bbb-3(b)(1), unless the authorization is  terminated or revoked. Performed at [facility], [address] [place], [place] [address]     Procedures and diagnostic studies:  MR ANGIO HEAD WO CONTRAST  Addendum Date: 04/21/2020   ADDENDUM REPORT: 04/21/2020 13:47 ADDENDUM: These results were called by telephone at the time of interpretation on 04/21/2020 at 1:46 pm to provider [facility] , who verbally acknowledged these results. Electronically Signed   By: [name] M.D.   On: 04/21/2020 13:47   Result Date: 04/21/2020 CLINICAL DATA:  Neuro deficit EXAM: MRI HEAD WITHOUT CONTRAST MRA HEAD WITHOUT CONTRAST TECHNIQUE: Multiplanar, multiecho pulse sequences of the brain and surrounding structures were obtained without intravenous contrast. Angiographic images of the head were obtained using MRA technique without contrast. COMPARISON:  04/21/2020 head CT. FINDINGS: Image quality is degraded by motion artifact. MRI HEAD FINDINGS Brain: Mild diffusion-weighted hyperintensity involving the posterior left frontal cortex and left periventricular white matter. Hemosiderin deposition involving the bilateral basal ganglia. No acute intracranial hemorrhage. Mild cerebral atrophy with ex vacuo dilatation. Mild chronic microvascular ischemic changes. Sequela of remote left corona radiata lacunar insults. No midline shift, ventriculomegaly or extra-axial fluid collection. No mass lesion. Vascular: Please see MRA head. Skull and upper cervical spine: Normal marrow signal.  Sinuses/Orbits: Normal orbits. Sequela of bilateral lens replacement. Left maxillary sinus mucous retention cyst. No mastoid effusion. Other: None. MRA HEAD FINDINGS Anterior circulation: Patent normal caliber internal carotid and anterior cerebral arteries. Bilateral carotid siphon atherosclerotic disease without significant narrowing. Mild distal right A1 segment narrowing. Left M2 occlusion involving the inferior trunk. Moderate short-segment narrowing involving the superior left M2 trunk. Posterior circulation: Patent normal caliber appearance of the vertebral, basilar, AICA, and left posterior cerebral artery. Mild right P1 segment narrowing. Proximally patent bilateral superior cerebellar arteries. No significant stenosis, proximal occlusion, aneurysm, or vascular malformation. Venous sinuses: No evidence of thrombosis. Anatomic variants: Bilateral posterior communicating arteries are either hypoplastic or absent. IMPRESSION: Acute/subacute insults involving the left frontal cortex and left periventricular white matter. Sequela of remote bilateral basal ganglia hemorrhages and remote left corona radiata insult. Mild cerebral atrophy and chronic microvascular ischemic changes. Left M2 occlusion involving the inferior trunk. Moderate short-segment narrowing of the superior left M2 trunk. Motion degraded exam. Electronically Signed: By: [name] M.D. On: 04/21/2020 13:37   MR BRAIN WO CONTRAST  Addendum Date: 04/21/2020   ADDENDUM REPORT: 04/21/2020 13:47 ADDENDUM: These results were called by telephone at the time of interpretation on 04/21/2020 at 1:46 pm to provider [facility] , who verbally acknowledged these results. Electronically Signed   By: [name] M.D.   On: 04/21/2020 13:47   Result Date: 04/21/2020 CLINICAL DATA:  Neuro deficit EXAM: MRI HEAD WITHOUT CONTRAST MRA HEAD WITHOUT CONTRAST TECHNIQUE: Multiplanar, multiecho pulse sequences of the brain and surrounding  structures were obtained without intravenous contrast. Angiographic images of the head were obtained using MRA technique without contrast. COMPARISON:  04/21/2020 head CT. FINDINGS: Image quality is degraded by motion artifact. MRI HEAD FINDINGS Brain: Mild diffusion-weighted hyperintensity involving the posterior left frontal cortex and left periventricular white matter. Hemosiderin deposition involving the bilateral basal ganglia. No acute intracranial hemorrhage.  Mild cerebral atrophy with ex vacuo dilatation. Mild chronic microvascular ischemic changes. Sequela of remote left corona radiata lacunar insults. No midline shift, ventriculomegaly or extra-axial fluid collection. No mass lesion. Vascular: Please see MRA head. Skull and upper cervical spine: Normal marrow signal. Sinuses/Orbits: Normal orbits. Sequela of bilateral lens replacement. Left maxillary sinus mucous retention cyst. No mastoid effusion. Other: None. MRA HEAD FINDINGS Anterior circulation: Patent normal caliber internal carotid and anterior cerebral arteries. Bilateral carotid siphon atherosclerotic disease without significant narrowing. Mild distal right A1 segment narrowing. Left M2 occlusion involving the inferior trunk. Moderate short-segment narrowing involving the superior left M2 trunk. Posterior circulation: Patent normal caliber appearance of the vertebral, basilar, AICA, and left posterior cerebral artery. Mild right P1 segment narrowing. Proximally patent bilateral superior cerebellar arteries. No significant stenosis, proximal occlusion, aneurysm, or vascular malformation. Venous sinuses: No evidence of thrombosis. Anatomic variants: Bilateral posterior communicating arteries are either hypoplastic or absent. IMPRESSION: Acute/subacute insults involving the left frontal cortex and left periventricular white matter. Sequela of remote bilateral basal ganglia hemorrhages and remote left corona radiata insult. Mild cerebral atrophy and  chronic microvascular ischemic changes. Left M2 occlusion involving the inferior trunk. Moderate short-segment narrowing of the superior left M2 trunk. Motion degraded exam. Electronically Signed: By: [name] M.D. On: 04/21/2020 13:37   EEG adult  Result Date: 04/22/2020 Charlsie Quest, MD     04/22/2020 10:21 AM Patient Name: Victoria Oconnell MRN: [id number] Epilepsy Attending: Charlsie Quest Referring Physician/Provider: Dr Milon Dikes Date: 04/22/2020 Duration: 23.40 mins Patient history: 86 year old female with sudden onset of change in mentation.  EEG to evaluate for seizures.  EEG to evaluate for seizure. Level of alertness: Awake, asleep AEDs during EEG study: None Technical aspects: This EEG study was done with scalp electrodes positioned according to the 10-20 International system of electrode placement. Electrical activity was acquired at a sampling rate of 500Hz  and reviewed with a high frequency filter of 70Hz  and a low frequency filter of 1Hz . EEG data were recorded continuously and digitally stored. Description: The posterior dominant rhythm consists of 8-9 Hz activity of moderate voltage (25-35 uV) seen predominantly in posterior head regions, symmetric and reactive to eye opening and eye closing. Sleep was characterized by vertex waves, sleep spindles (12 to 14 Hz), maximal frontocentral region.  EEG showed continuous generalized 3 to 6 Hz theta-delta slowing.  Hyperventilation and photic stimulation were not performed.   ABNORMALITY -Continuous slow, generalized IMPRESSION: This study is suggestive of mild diffuse encephalopathy, nonspecific etiology. No seizures or epileptiform discharges were seen throughout the recording. Charlsie Quest   CT HEAD CODE STROKE WO CONTRAST  Result Date: 04/21/2020 CLINICAL DATA:  Code stroke. Altered mental status and speech difficulty EXAM: CT HEAD WITHOUT CONTRAST TECHNIQUE: Contiguous axial images were obtained from the base of the skull  through the vertex without intravenous contrast. COMPARISON:  None. FINDINGS: Brain: No evidence of acute infarction, hemorrhage, hydrocephalus, extra-axial collection or mass lesion/mass effect. Cerebral volume loss and mild chronic white matter disease for age. Vascular: No hyperdense vessel or unexpected calcification. Skull: Normal. Negative for fracture or focal lesion. Sinuses/Orbits: No acute finding. Other: These results were communicated to Dr. Wilford Corner at 12:20 pmon 9/26/2021by text page via the [facility] messaging system. ASPECTS [facility] Stroke Program Early CT Score) - Ganglionic level infarction (caudate, lentiform nuclei, internal capsule, insula, M1-M3 cortex): 7 - Supraganglionic infarction (M4-M6 cortex): 3 Total score (0-10 with 10 being normal): 10 IMPRESSION: No acute finding.ASPECTS is 10. Electronically  Signed   By: Marnee Spring M.D.   On: 04/21/2020 12:21   VAS US CAROTID  Result Date: 04/22/2020 Carotid Arterial Duplex Study Indications:       CVA, Speech disturbance and Ataxia. Risk Factors:      Hypertension, coronary artery disease. Other Factors:     A-fib. Comparison Study:  No prior study on file Performing Technologist: Sherren Kerns RVS  Examination Guidelines: A complete evaluation includes B-mode imaging, spectral Doppler, color Doppler, and power Doppler as needed of all accessible portions of each vessel. Bilateral testing is considered an integral part of a complete examination. Limited examinations for reoccurring indications may be performed as noted.  Right Carotid Findings: +----------+--------+--------+--------+------------------+---------+             PSV cm/s EDV cm/s Stenosis Plaque Description Comments   +----------+--------+--------+--------+------------------+---------+  CCA Prox   75       14                heterogenous                  +----------+--------+--------+--------+------------------+---------+  CCA Distal 66       12                heterogenous                   +----------+--------+--------+--------+------------------+---------+  ICA Prox   75       16                calcific           Shadowing  +----------+--------+--------+--------+------------------+---------+  ICA Distal 76       22                                              +----------+--------+--------+--------+------------------+---------+  ECA        83       16                                              +----------+--------+--------+--------+------------------+---------+ +----------+--------+-------+--------+-------------------+             PSV cm/s EDV cms Describe Arm Pressure (mmHG)  +----------+--------+-------+--------+-------------------+  Subclavian 93                                             +----------+--------+-------+--------+-------------------+ +---------+--------+--+--------+--+  Vertebral PSV cm/s 52 EDV cm/s 15  +---------+--------+--+--------+--+  Left Carotid Findings: +----------+--------+--------+--------+------------------+--------+             PSV cm/s EDV cm/s Stenosis Plaque Description Comments  +----------+--------+--------+--------+------------------+--------+  CCA Prox   84       16                homogeneous                  +----------+--------+--------+--------+------------------+--------+  CCA Distal 73       18                heterogenous                 +----------+--------+--------+--------+------------------+--------+  ICA  Prox   56       18                heterogenous                 +----------+--------+--------+--------+------------------+--------+  ICA Distal 82       27                                             +----------+--------+--------+--------+------------------+--------+  ECA        84       12                                             +----------+--------+--------+--------+------------------+--------+ +----------+--------+--------+--------+-------------------+             PSV cm/s EDV cm/s Describe Arm Pressure (mmHG)   +----------+--------+--------+--------+-------------------+  Subclavian 60                                              +----------+--------+--------+--------+-------------------+ +---------+--------+--+--------+--+  Vertebral PSV cm/s 40 EDV cm/s 14  +---------+--------+--+--------+--+   Summary: Right Carotid: Velocities in the right ICA are consistent with a 1-39% stenosis. Left Carotid: Velocities in the left ICA are consistent with a 1-39% stenosis. Vertebrals:  Bilateral vertebral arteries demonstrate antegrade flow. Subclavians: Normal flow hemodynamics were seen in bilateral subclavian              arteries. *See table(s) above for measurements and observations.  Electronically signed by Delia Heady MD on 04/22/2020 at 12:53:42 PM.    Final     Medications:    bisoprolol  5 mg Oral Daily   digoxin  0.125 mg Oral Daily   FLUoxetine  40 mg Oral Daily   levothyroxine  88 mcg Oral QAC breakfast   mometasone-formoterol  2 puff Inhalation BID   pantoprazole (PROTONIX) IV  40 mg Intravenous Q24H   pravastatin  40 mg Oral QHS   Continuous Infusions:  sodium chloride 20 mL/hr at 04/22/20 0010     LOS: 3 days   Joseph Art  [facility]   How to contact the [facility] Attending or Consulting provider 7A - 7P or covering provider during after hours 7P -7A, for this patient?  1. Check the care team in [facility] and look for a) attending/consulting [facility] provider listed and b) the [facility] team listed 2. Log into [email or web address] and use [facility]'s universal password to access. If you do not have the password, please contact the hospital operator. 3. Locate the [facility] provider you are looking for under [facility] and page to a number that you can be directly reached. 4. If you still have difficulty reaching the provider, please page the [facility] (Director on Call) for the Hospitalists listed on amion for assistance.  04/22/2020, 3:57 PM

## 2020-04-22 NOTE — Evaluation (Signed)
Physical Therapy Re-Evaluation Patient Details Name: Victoria Oconnell MRN: [id number] DOB: Jul 05, 1930 Today's Date: 04/22/2020   History of Present Illness  86 y.o. female presenting with GI bleed with ABLA. Pt with abrupt onset of impaired speech and mentation on 9/26, MRI + for acute/subacute infarcts involving L frontal and periventricular white matter. Per medical chart, patient with multiple recent hospital admissions for GI bleeds. PMHx significant for CAD w/ stent placement, CHF, diverticulosis, nocturnal O2 dependence, and A-fib.     Clinical Impression  The pt was OOB in the recliner upon arrival of PT for evaluation following code stroke. She reports no changes in sensation or strength in her upper or lower extremities, but is an unreliable historian as she also reports not being informed of any changes in her health or status since arrival at hospital. The pt states her adult son lives with her and can assist as needed following d/c, but no family has been present to verify. The pt was able to complete bed mobility, transfers, and short ambulation in the room with minG and RW for safety, without LOB. The pt does require intermittent minA to navigate around obstacles in the room, but otherwise is moving well compared to initial evaluation. However, the pt's cognition waxed and waned through the session, including: the pt intermittently having word-finding difficulty or not responding to questions asked and then being frustrated when asked a second time. The pt was able to name her address accurately, but became frustrated and tearful with other lines of questioning despite attempts of PT to explain reasoning. If the pt's family feels they can provide 24/7 supervision and assist as needed, home may be the best d/c plan for the pt's safety and cognition, but the pt will definitely need 24/7 supervision for safety at this time.     Follow Up Recommendations Supervision/Assistance - 24 hour;SNF (vs  progression home with HHPT)    Equipment Recommendations  3in1 (PT)    Recommendations for Other Services       Precautions / Restrictions Precautions Precautions: Fall Restrictions Weight Bearing Restrictions: No      Mobility  Bed Mobility Overal bed mobility: Needs Assistance Bed Mobility: Sit to Supine     Supine to sit: Supervision Sit to supine: Supervision   General bed mobility comments: no physical assist given  Transfers Overall transfer level: Needs assistance Equipment used: Rolling walker (2 wheeled);1 person hand held assist Transfers: Sit to/from Stand Sit to Stand: Min guard;Min assist Stand pivot transfers: Supervision;Min assist       General transfer comment: supervision with use of RW, minA through HHA to complete stand and pivot transfer  Ambulation/Gait Ambulation/Gait assistance: Supervision Gait Distance (Feet): 30 Feet Assistive device: Rolling walker (2 wheeled) Gait Pattern/deviations: Step-through pattern Gait velocity: slowed Gait velocity interpretation: <1.31 ft/sec, indicative of household ambulator General Gait Details: intermittent assist for steering RW. pt reports she wears glassess at home.   Modified Rankin (Stroke Patients Only) Modified Rankin (Stroke Patients Only) Pre-Morbid Rankin Score: Moderate disability Modified Rankin: Moderately severe disability     Balance Overall balance assessment: Needs assistance Sitting-balance support: Feet supported Sitting balance-Leahy Scale: Good Sitting balance - Comments: no LOB pulling up socks   Standing balance support: Bilateral upper extremity supported;During functional activity Standing balance-Leahy Scale: Poor Standing balance comment: requiring single UE support                             Pertinent  Vitals/Pain Pain Assessment: No/denies pain    Home Living Family/patient expects to be discharged to:: Private residence Living Arrangements:  Children (son lives with her) Available Help at Discharge: Family;Available 24 hours/day Type of Home: House Home Access: Stairs to enter Entrance Stairs-Rails: Right Entrance Stairs-Number of Steps: 1 Home Layout: Two level;Able to live on main level with bedroom/bathroom Home Equipment: Walker - 4 wheels;Cane - single point;Shower seat Additional Comments: Reports has a vanity near her toilet    Prior Function Level of Independence: Independent with assistive device(s)         Comments: Patient reports independence with meal prep and light housekeeping and use of SPC in home and community dwellings. Patient was sitting to shower.      Hand Dominance   Dominant Hand: Right       Communication   Communication: No difficulties  Cognition Arousal/Alertness: Awake/alert Behavior During Therapy: Flat affect;Agitated Overall Cognitive Status: History of cognitive impairments - at baseline Area of Impairment: Orientation;Attention;Memory;Following commands;Safety/judgement;Awareness;Problem solving                 Orientation Level: Disoriented to;Situation;Time Current Attention Level: Sustained Memory: Decreased short-term memory Following Commands: Follows one step commands with increased time;Follows multi-step commands inconsistently Safety/Judgement: Decreased awareness of safety;Decreased awareness of deficits Awareness: Intellectual Problem Solving: Slow processing;Decreased initiation;Difficulty sequencing;Requires verbal cues;Requires tactile cues General Comments: Pt cognition waxing and waning both through eval and compared to OT eval earlier this morning. The pt continues to insist her name is Victoria Oconnell, but stated "Victoria Oconnell" when asked what her last name was. She was able to name her street address and son's name accurately. She became increasingly agitated and emotional through evaluation, staring off unresponsive after being asked a question, but then  frustrated when question repeated.      General Comments General comments (skin integrity, edema, etc.): Pt denies any changes in sensation or strength, reports no knowledge of what has been happening to her since she has been here and became increasingly frustrated by questions and mobility tests during PT session.    Exercises Other Exercises Other Exercises: 5xsts in 33 sec (slowed by need to explain directions x3 during test), use of BUE and RW   Assessment/Plan    PT Assessment Patient needs continued PT services  PT Problem List Decreased strength;Decreased activity tolerance;Decreased balance;Decreased mobility;Decreased knowledge of use of DME;Decreased cognition;Decreased safety awareness       PT Treatment Interventions DME instruction;Gait training;Stair training;Functional mobility training;Therapeutic activities;Therapeutic exercise;Patient/family education    PT Goals (Current goals can be found in the Care Plan section)  Acute Rehab PT Goals Patient Stated Goal: to think clearly PT Goal Formulation: With patient Time For Goal Achievement: 05/05/20 Potential to Achieve Goals: Good    Frequency Min 3X/week   Barriers to discharge Decreased caregiver support         AM-PAC PT "6 Clicks" Mobility  Outcome Measure Help needed turning from your back to your side while in a flat bed without using bedrails?: None Help needed moving from lying on your back to sitting on the side of a flat bed without using bedrails?: A Little Help needed moving to and from a bed to a chair (including a wheelchair)?: A Little Help needed standing up from a chair using your arms (e.g., wheelchair or bedside chair)?: A Little Help needed to walk in hospital room?: A Little Help needed climbing 3-5 steps with a railing? : A Little 6 Click Score: 19  End of Session Equipment Utilized During Treatment: Gait belt Activity Tolerance: Patient tolerated treatment well Patient left: in  bed;with call bell/phone within reach;with bed alarm set (staff present to do echo) Nurse Communication: Mobility status PT Visit Diagnosis: Other abnormalities of gait and mobility (R26.89);Muscle weakness (generalized) (M62.81)    Time: 5364-6803 PT Time Calculation (min) (ACUTE ONLY): 25 min   Charges:   PT Evaluation $PT Re-evaluation: 1 Re-eval PT Treatments $Gait Training: 8-22 mins        Rolm Baptise, PT, DPT   Acute Rehabilitation Department Pager #: [phone]  Gaetana Michaelis 04/22/2020, 3:55 PM

## 2020-04-22 NOTE — Care Management Important Message (Signed)
Important Message  Patient Details  Name: Victoria Oconnell MRN: [id number] Date of Birth: 1929-09-30   Medicare Important Message Given:  Yes     Dorena Bodo 04/22/2020, 2:28 PM

## 2020-04-22 NOTE — Progress Notes (Signed)
Met w Elmyra Ricks, pt's daughter in law. REviewed visitation policy related to one visitor; pt has confusion and discussed staying overnight. Verbalized understanding. Pt up I chair. Alert. Eating lunch. Some confusion noted. Safety sitter in room. Family from out of state coming in to town Tuesday. --JM

## 2020-04-22 NOTE — Progress Notes (Signed)
2145: Pt's daugther Yamira Papa ( phone: [phone], 1st person to call in case of emergency) called, stated pt has Advance medical directive since Mar 06 2008, that states she doesn't want to have tube down her throat or chest compression. Stated she would like to Talk to a doctor and do what ever needs to be done to make her DNR as pt is not capable of making her decision . She stated if anything happens during night, or in case of another stroke she wants to make sure her status is updated. I told her I will get in touch with doctor and she can talk to MD.  2148: called Dr. Marga Melnick, notified of pt's advance directive and wanting to be DNR. Gave Joan's number , Md stated he will talk to daughter.  2155: DNR status updated per DR Opyd, arm band applied to pt's right wrist. verified with charge RN. Call bell within reach. Will continue to monitor.

## 2020-04-22 NOTE — Progress Notes (Signed)
STROKE TEAM PROGRESS NOTE   INTERVAL HISTORY Patient is sitting up in bed.  She appears aphasic with significant word finding difficulties and speaks only short sentences.  She can comprehend only simple midline and one-step commands.  She has diminished attention, registration and recall.  Significant word finding difficulties.  MRI scan shows subtle left frontal cortical hyperintensity unclear as to seizure versus stroke.  There is also a second left periventricular diffusion hyperintensity which is likely T2 shine through from an old infarct EEG shows diffuse slowing without definite epileptiform activity  Vitals:   04/22/20 0349 04/22/20 0742 04/22/20 0841 04/22/20 1122  BP: (!) 161/87 (!) 124/104  (!) 152/85  Pulse: 92 87 79 80  Resp: 20 18 18 18   Temp: 98.2 F (36.8 C) 98.5 F (36.9 C)  98.3 F (36.8 C)  TempSrc: Oral Oral  Oral  SpO2: 95% 96% 97% 96%  Weight:      Height:       CBC:  Recent Labs  Lab 04/21/20 0530 04/22/20 0321  WBC 7.5 7.8  HGB 9.0* 9.6*  HCT 28.4* 30.8*  MCV 91.0 89.3  PLT 346 369   Basic Metabolic Panel:  Recent Labs  Lab 04/21/20 0530 04/22/20 0321  NA 133* 133*  K 3.8 4.2  CL 102 102  CO2 25 24  GLUCOSE 95 95  BUN 10 10  CREATININE 0.95 0.97  CALCIUM 8.4* 8.6*   Lipid Panel:  Recent Labs  Lab 04/21/20 0530  CHOL 105  TRIG 58  HDL 39*  CHOLHDL 2.7  VLDL 12  LDLCALC 54   HgbA1c:  Recent Labs  Lab 04/21/20 0530  HGBA1C 5.3   Urine Drug Screen: No results for input(s): LABOPIA, COCAINSCRNUR, LABBENZ, AMPHETMU, THCU, LABBARB in the last 168 hours.  Alcohol Level No results for input(s): ETH in the last 168 hours.  IMAGING past 24 hours MR ANGIO HEAD WO CONTRAST  Addendum Date: 04/21/2020   ADDENDUM REPORT: 04/21/2020 13:47 ADDENDUM: These results were called by telephone at the time of interpretation on 04/21/2020 at 1:46 pm to provider [facility] , who verbally acknowledged these results. Electronically Signed   By:  [facility] M.D.   On: 04/21/2020 13:47   Result Date: 04/21/2020 CLINICAL DATA:  Neuro deficit EXAM: MRI HEAD WITHOUT CONTRAST MRA HEAD WITHOUT CONTRAST TECHNIQUE: Multiplanar, multiecho pulse sequences of the brain and surrounding structures were obtained without intravenous contrast. Angiographic images of the head were obtained using MRA technique without contrast. COMPARISON:  04/21/2020 head CT. FINDINGS: Image quality is degraded by motion artifact. MRI HEAD FINDINGS Brain: Mild diffusion-weighted hyperintensity involving the posterior left frontal cortex and left periventricular white matter. Hemosiderin deposition involving the bilateral basal ganglia. No acute intracranial hemorrhage. Mild cerebral atrophy with ex vacuo dilatation. Mild chronic microvascular ischemic changes. Sequela of remote left corona radiata lacunar insults. No midline shift, ventriculomegaly or extra-axial fluid collection. No mass lesion. Vascular: Please see MRA head. Skull and upper cervical spine: Normal marrow signal. Sinuses/Orbits: Normal orbits. Sequela of bilateral lens replacement. Left maxillary sinus mucous retention cyst. No mastoid effusion. Other: None. MRA HEAD FINDINGS Anterior circulation: Patent normal caliber internal carotid and anterior cerebral arteries. Bilateral carotid siphon atherosclerotic disease without significant narrowing. Mild distal right A1 segment narrowing. Left M2 occlusion involving the inferior trunk. Moderate short-segment narrowing involving the superior left M2 trunk. Posterior circulation: Patent normal caliber appearance of the vertebral, basilar, AICA, and left posterior cerebral artery. Mild right P1 segment narrowing. Proximally patent  bilateral superior cerebellar arteries. No significant stenosis, proximal occlusion, aneurysm, or vascular malformation. Venous sinuses: No evidence of thrombosis. Anatomic variants: Bilateral posterior communicating arteries are either  hypoplastic or absent. IMPRESSION: Acute/subacute insults involving the left frontal cortex and left periventricular white matter. Sequela of remote bilateral basal ganglia hemorrhagesStana Bunting corona radiata insult. Mild cerebral atrophy and chronic microvascular ischemic changes. Left M2 occlusion involving the inferior trunk. Moderate short-segment narrowing of the superior left M2 trunk. Motion degraded exam. Electronically Signed: By: Chikanele  Emekauwa M.D. On: 04/21/2020 13:37   MR BRAIN WO CONTRAST  Addendum Date: 04/21/2020   ADDENDUM REPORT: 04/21/2020 13:47 ADDENDUM: These results were called by telephone at the time of interpretation on 04/21/2020 at 1:46 pm to provider [facility] , who verbally acknowledged these results. Electronically Signed   By: [name] M.D.   On: 04/21/2020 13:47   Result Date: 04/21/2020 CLINICAL DATA:  Neuro deficit EXAM: MRI HEAD WITHOUT CONTRAST MRA HEAD WITHOUT CONTRAST TECHNIQUE: Multiplanar, multiecho pulse sequences of the brain and surrounding structures were obtained without intravenous contrast. Angiographic images of the head were obtained using MRA technique without contrast. COMPARISON:  04/21/2020 head CT. FINDINGS: Image quality is degraded by motion artifact. MRI HEAD FINDINGS Brain: Mild diffusion-weighted hyperintensity involving the posterior left frontal cortex and left periventricular white matter. Hemosiderin deposition involving the bilateral basal ganglia. No acute intracranial hemorrhage. Mild cerebral atrophy with ex vacuo dilatation. Mild chronic microvascular ischemic changes. Sequela of remote left corona radiata lacunar insults. No midline shift, ventriculomegaly or extra-axial fluid collection. No mass lesion. Vascular: Please see MRA head. Skull and upper cervical spine: Normal marrow signal. Sinuses/Orbits: Normal orbits. Sequela of bilateral lens replacement. Left maxillary sinus mucous retention cyst. No mastoid  effusion. Other: None. MRA HEAD FINDINGS Anterior circulation: Patent normal caliber internal carotid and anterior cerebral arteries. Bilateral carotid siphon atherosclerotic disease without significant narrowing. Mild distal right A1 segment narrowing. Left M2 occlusion involving the inferior trunk. Moderate short-segment narrowing involving the superior left M2 trunk. Posterior circulation: Patent normal caliber appearance of the vertebral, basilar, AICA, and left posterior cerebral artery. Mild right P1 segment narrowing. Proximally patent bilateral superior cerebellar arteries. No significant stenosis, proximal occlusion, aneurysm, or vascular malformation. Venous sinuses: No evidence of thrombosis. Anatomic variants: Bilateral posterior communicating arteries are either hypoplastic or absent. IMPRESSION: Acute/subacute insults involving the left frontal cortex and left periventricular white matter. Sequela of remote bilateral basal ganglia hemorrhages and remote left corona radiata insult. Mild cerebral atrophy and chronic microvascular ischemic changes. Left M2 occlusion involving the inferior trunk. Moderate short-segment narrowing of the superior left M2 trunk. Motion degraded exam. Electronically Signed: By: [name] M.D. On: 04/21/2020 13:37   EEG adult  Result Date: 04/22/2020 Charlsie Quest, MD     04/22/2020 10:21 AM Patient Name: Victoria Oconnell MRN: [id number] Epilepsy Attending: Charlsie Quest Referring Physician/Provider: Dr Milon Dikes Date: 04/22/2020 Duration: 23.40 mins Patient history: 86 year old female with sudden onset of change in mentation.  EEG to evaluate for seizures.  EEG to evaluate for seizure. Level of alertness: Awake, asleep AEDs during EEG study: None Technical aspects: This EEG study was done with scalp electrodes positioned according to the 10-20 International system of electrode placement. Electrical activity was acquired at a sampling rate of  and  reviewed with a high frequency filter of  and a low frequency filter of . EEG data were recorded continuously and digitally stored. Description: The posterior dominant rhythm consists of 8-9  Hz activity of moderate voltage (25-35 uV) seen predominantly in posterior head regions, symmetric and reactive to eye opening and eye closing. Sleep was characterized by vertex waves, sleep spindles (12 to 14 Hz), maximal frontocentral region.  EEG showed continuous generalized 3 to 6 Hz theta-delta slowing.  Hyperventilation and photic stimulation were not performed.   ABNORMALITY -Continuous slow, generalized IMPRESSION: This study is suggestive of mild diffuse encephalopathy, nonspecific etiology. No seizures or epileptiform discharges were seen throughout the recording. Charlsie Quest   CT HEAD CODE STROKE WO CONTRAST  Result Date: 04/21/2020 CLINICAL DATA:  Code stroke. Altered mental status and speech difficulty EXAM: CT HEAD WITHOUT CONTRAST TECHNIQUE: Contiguous axial images were obtained from the base of the skull through the vertex without intravenous contrast. COMPARISON:  None. FINDINGS: Brain: No evidence of acute infarction, hemorrhage, hydrocephalus, extra-axial collection or mass lesion/mass effect. Cerebral volume loss and mild chronic white matter disease for age. Vascular: No hyperdense vessel or unexpected calcification. Skull: Normal. Negative for fracture or focal lesion. Sinuses/Orbits: No acute finding. Other: These results were communicated to Dr. Wilford Corner at 12:20 pmon 9/26/2021by text page via the [facility] messaging system. ASPECTS [facility] Stroke Program Early CT Score) - Ganglionic level infarction (caudate, lentiform nuclei, internal capsule, insula, M1-M3 cortex): 7 - Supraganglionic infarction (M4-M6 cortex): 3 Total score (0-10 with 10 being normal): 10 IMPRESSION: No acute finding.ASPECTS is 10. Electronically Signed   By: Marnee Spring M.D.   On: 04/21/2020 12:21   VAS US  CAROTID  Result Date: 04/21/2020 Carotid Arterial Duplex Study Indications:       CVA, Speech disturbance and Ataxia. Risk Factors:      Hypertension, coronary artery disease. Other Factors:     A-fib. Comparison Study:  No prior study on file Performing Technologist: Sherren Kerns RVS  Examination Guidelines: A complete evaluation includes B-mode imaging, spectral Doppler, color Doppler, and power Doppler as needed of all accessible portions of each vessel. Bilateral testing is considered an integral part of a complete examination. Limited examinations for reoccurring indications may be performed as noted.  Right Carotid Findings: +----------+--------+--------+--------+------------------+---------+           PSV cm/sEDV cm/sStenosisPlaque DescriptionComments  +----------+--------+--------+--------+------------------+---------+ CCA Prox  75      14              heterogenous                +----------+--------+--------+--------+------------------+---------+ CCA Distal66      12              heterogenous                +----------+--------+--------+--------+------------------+---------+ ICA Prox  75      16              calcific          Shadowing +----------+--------+--------+--------+------------------+---------+ ICA Distal76      22                                          +----------+--------+--------+--------+------------------+---------+ ECA       83      16                                          +----------+--------+--------+--------+------------------+---------+ +----------+--------+-------+--------+-------------------+  PSV cm/sEDV cmsDescribeArm Pressure (mmHG) +----------+--------+-------+--------+-------------------+ [id number]                                         +----------+--------+-------+--------+-------------------+ +---------+--------+--+--------+--+ VertebralPSV cm/s52EDV cm/s15 +---------+--------+--+--------+--+  Left  Carotid Findings: +----------+--------+--------+--------+------------------+--------+           PSV cm/sEDV cm/sStenosisPlaque DescriptionComments +----------+--------+--------+--------+------------------+--------+ CCA Prox  84      16              homogeneous                +----------+--------+--------+--------+------------------+--------+ CCA Distal73      18              heterogenous               +----------+--------+--------+--------+------------------+--------+ ICA Prox  56      18              heterogenous               +----------+--------+--------+--------+------------------+--------+ ICA Distal82      27                                         +----------+--------+--------+--------+------------------+--------+ ECA       84      12                                         +----------+--------+--------+--------+------------------+--------+ +----------+--------+--------+--------+-------------------+           PSV cm/sEDV cm/sDescribeArm Pressure (mmHG) +----------+--------+--------+--------+-------------------+ [id number]                                          +----------+--------+--------+--------+-------------------+ +---------+--------+--+--------+--+ VertebralPSV cm/s40EDV cm/s14 +---------+--------+--+--------+--+   Summary: Right Carotid: Velocities in the right ICA are consistent with a 1-39% stenosis. Left Carotid: Velocities in the left ICA are consistent with a 1-39% stenosis. Vertebrals:  Bilateral vertebral arteries demonstrate antegrade flow. Subclavians: Normal flow hemodynamics were seen in bilateral subclavian              arteries. *See table(s) above for measurements and observations.     Preliminary     PHYSICAL EXAM Frail elderly Caucasian lady not in distress. . Afebrile. Head is nontraumatic. Neck is supple without bruit.    Cardiac exam no murmur or gallop. Lungs are clear to auscultation. Distal pulses are well  felt. Neurological Exam : She is awake alert she has significant expressive language difficulties with comprehension also limited to simple midline and one-step commands.  Significant word finding difficulties and paraphasic errors.  She blinks to threat on both sides.  Eye movements are full range.  Face is symmetric.  Tongue midline.  Motor system exam able to move all 4 extremities equally well against gravity without focal weakness.  Sensation appears preserved.  Gait not tested. ASSESSMENT/PLAN Victoria Oconnell is a 86 y.o. female with history of atrial fibrillation not on anticoagulation due to bleeding being considered for a watchman device for stroke prevention, mild dementia, coronary artery disease, congestive heart failure, hypertension, being treated in the hospital for a GI bleed who developed  sudden change in mentation and speech.   Stroke:  L frontal subtle DWI abnormality, unclear if stroke or artifact or seizure - regardless, aphasia out of proportion to size, ? dementia related Also has a subacute L subcortical infarct likely age-indeterminate and from small vessel disease.  [facility]   Code Stroke CT head No acute abnormality. ASPECTS 10.     MRI / MRA  Acute/subacute L frontal cortex and L periventricular white matter infarcts. Old B basal ganglia hemorrhages. Old L corona radiata infarct. Small vessel disease. Atrophy. L M2 occlusion.  Repeat limited MRI to look at stroke  Carotid Doppler  B ICA 1-39% stenosis, VAs antegrade   2D Echo pending   EEG generalized slowing  LDL 54  HgbA1c 5.3  VTE prophylaxis - SCDs   aspirin 81 mg daily prior to admission, now on No antithrombotic given anemia requiring transfusion  Therapy recommendations:  HH PT, HH OT, HH SLP   Disposition:  pending   DNR  Atrial Fibrillation  Home anticoagulation:  none (previously on Xarelto, last dose 2 weeks ago)  Being considered for watchman device  Not an AC  candidate given recent GI bleeding   Hypertension  Stable . BP goal normotensive  Hyperlipidemia  Home meds:  pravachol 40, resumed in hospital  LDL 54, goal < 70  Continue statin at discharge  Other Stroke Risk Factors  Advanced age  Coronary artery disease  Congestive heart failure  Other Active Problems  Dementia   GI bleeding, recent, without known source - multiple recent hospitalizations. Hgb as low as 4.9, transfused. no etiology found on capsule endoscopy. Previous colon and EGD unrevealing. Repeat colon, upper endo and capsule endo neg.   Asthma, on nocturnal O2  Hypothyroidism   Renal dysfunction  Hospital day # 3 Patient's aphasia and mental status appears way out of proportion to the tiny left frontal cortical hyperintensity seen on DWI hence recommend repeat MRI to see if it involves into infarct or is actually ictal effect.  Patient has A. fib but due to GI bleeding and suspect underlying dementia is not a good candidate for long-term anticoagulation.  No family available for discussion.  Discussed with Dr. Benjamine MolaVann.  Greater than 50% time during this 35-minute visit was spent in counseling and coordination of care about her stroke versus seizure discussion and answering questions  To contact Stroke Continuity provider, please refer to [email or web address]. After hours, contact General Neurology

## 2020-04-22 NOTE — Procedures (Signed)
Patient Name: Victoria Oconnell  MRN: [id number]  Epilepsy Attending: Charlsie Quest  Referring Physician/Provider: Dr Milon Dikes Date: 04/22/2020 Duration: 23.40 mins  Patient history: 86 year old female with sudden onset of change in mentation.  EEG to evaluate for seizures.  EEG to evaluate for seizure.  Level of alertness: Awake, asleep  AEDs during EEG study: None  Technical aspects: This EEG study was done with scalp electrodes positioned according to the 10-20 International system of electrode placement. Electrical activity was acquired at a sampling rate of 500Hz  and reviewed with a high frequency filter of 70Hz  and a low frequency filter of 1Hz . EEG data were recorded continuously and digitally stored.   Description: The posterior dominant rhythm consists of 8-9 Hz activity of moderate voltage (25-35 uV) seen predominantly in posterior head regions, symmetric and reactive to eye opening and eye closing. Sleep was characterized by vertex waves, sleep spindles (12 to 14 Hz), maximal frontocentral region.  EEG showed continuous generalized 3 to 6 Hz theta-delta slowing.  Hyperventilation and photic stimulation were not performed.     ABNORMALITY -Continuous slow, generalized  IMPRESSION: This study is suggestive of mild diffuse encephalopathy, nonspecific etiology. No seizures or epileptiform discharges were seen throughout the recording.  Victoria Oconnell 

## 2020-04-22 NOTE — Progress Notes (Signed)
Occupational Therapy Treatment Patient Details Name: Victoria Oconnell MRN: [id number] DOB: 1929/10/06 Today's Date: 04/22/2020    History of present illness 86 y.o. female presenting with GI bleed with ABLA. Pt with abrupt onset of impaired speech and mentation on 9/26, MRI + for acute/subacute infarcts involving L frontal and periventricular white matter. Per medical chart, patient with multiple recent hospital admissions for GI bleeds. PMHx significant for CAD w/ stent placement, CHF, diverticulosis, nocturnal O2 dependence, and A-fib.    OT comments  Pt with significant change in cognition since initial evaluation. Pt scored in the "Impairment Consistent with Dementia" on the Short Blessed Test. She was unable to complete any items involving orientation, memory or mental manipulation. Pt aware of her deficits and tearful at times. Pt unable to recall her name or the names of her children, but aware she is from [place], [place]. Pt needing moderate assistance to negotiate around furniture in her room with RW. Updated d/c recommendation to SNF.   Follow Up Recommendations  SNF;Supervision/Assistance - 24 hour    Equipment Recommendations  Other (comment) (defer to next venue)    Recommendations for Other Services      Precautions / Restrictions Precautions Precautions: Fall       Mobility Bed Mobility Overal bed mobility: Needs Assistance Bed Mobility: Sit to Supine     Supine to sit: Supervision     General bed mobility comments: no physical assist, HOB up  Transfers Overall transfer level: Needs assistance Equipment used: Rolling walker (2 wheeled) Transfers: Sit to/from Stand Sit to Stand: Min guard         General transfer comment: cues for hand placement    Balance Overall balance assessment: Needs assistance   Sitting balance-Leahy Scale: Good Sitting balance - Comments: no LOB pulling up socks     Standing balance-Leahy Scale: Poor Standing balance comment:  requiring B UE support                           ADL either performed or assessed with clinical judgement   ADL       Grooming: Brushing hair;Standing;Min guard           Upper Body Dressing : Moderate assistance;Sitting   Lower Body Dressing: Set up;Sitting/lateral leans   Toilet Transfer: Moderate assistance;Ambulation;RW           Functional mobility during ADLs: Moderate assistance;Rolling walker General ADL Comments: pt requiring moderate assistance to manage walker     Vision       Perception     Praxis      Cognition Arousal/Alertness: Awake/alert Behavior During Therapy: Flat affect Overall Cognitive Status: Impaired/Different from baseline Area of Impairment: Orientation;Attention;Memory;Following commands;Safety/judgement;Awareness;Problem solving                 Orientation Level: Disoriented to;Person;Place;Time;Situation Current Attention Level: Sustained Memory: Decreased short-term memory Following Commands: Follows one step commands with increased time Safety/Judgement: Decreased awareness of safety;Decreased awareness of deficits Awareness: Intellectual Problem Solving: Slow processing;Decreased initiation;Difficulty sequencing;Requires verbal cues;Requires tactile cues General Comments: pt with significant changes in cognition from evaluation        Exercises     Shoulder Instructions       General Comments      Pertinent Vitals/ Pain       Pain Assessment: No/denies pain  Home Living  Prior Functioning/Environment              Frequency  Min 2X/week        Progress Toward Goals  OT Goals(current goals can now be found in the care plan section)  Progress towards OT goals: Progressing toward goals  Acute Rehab OT Goals Patient Stated Goal: to think clearly OT Goal Formulation: With patient Time For Goal Achievement: 05/05/20 Potential to  Achieve Goals: Good  Plan Discharge plan needs to be updated    Co-evaluation                 AM-PAC OT "6 Clicks" Daily Activity     Outcome Measure   Help from another person eating meals?: None Help from another person taking care of personal grooming?: A Little Help from another person toileting, which includes using toliet, bedpan, or urinal?: A Lot Help from another person bathing (including washing, rinsing, drying)?: A Little Help from another person to put on and taking off regular upper body clothing?: A Little Help from another person to put on and taking off regular lower body clothing?: A Little 6 Click Score: 18    End of Session Equipment Utilized During Treatment: Gait belt;Rolling walker  OT Visit Diagnosis: Unsteadiness on feet (R26.81);Other symptoms and signs involving cognitive function   Activity Tolerance Patient tolerated treatment well   Patient Left in chair;with call bell/phone within reach;with chair alarm set   Nurse Communication          Time: 1122-1208 OT Time Calculation (min): 46 min  Charges: OT General Charges $OT Visit: 1 Visit OT Treatments $Self Care/Home Management : 23-37 mins $Cognitive Funtion inital: Initial 15 mins  Martie Round, OTR/L Acute Rehabilitation Services Pager: [phone] Office: [phone]   Evern Bio 04/22/2020, 1:03 PM

## 2020-04-23 ENCOUNTER — Inpatient Hospital Stay (HOSPITAL_COMMUNITY): Payer: Medicare Other | Admitting: Anesthesiology

## 2020-04-23 ENCOUNTER — Inpatient Hospital Stay (HOSPITAL_COMMUNITY): Payer: Medicare Other

## 2020-04-23 ENCOUNTER — Encounter (HOSPITAL_COMMUNITY): Admission: EM | Disposition: E | Payer: Self-pay | Source: Home / Self Care | Attending: Internal Medicine

## 2020-04-23 HISTORY — PX: RADIOLOGY WITH ANESTHESIA: SHX6223

## 2020-04-23 LAB — TYPE AND SCREEN
ABO/RH(D): O POS
Antibody Screen: NEGATIVE
Unit division: 0
Unit division: 0
Unit division: 0
Unit division: 0

## 2020-04-23 LAB — BPAM RBC
Blood Product Expiration Date: 202110252359
Blood Product Expiration Date: 202110252359
Blood Product Expiration Date: 202110252359
Blood Product Expiration Date: 202110252359
ISSUE DATE / TIME: 202109221809
ISSUE DATE / TIME: 202109231250
ISSUE DATE / TIME: 202109240744
ISSUE DATE / TIME: 202109241021
Unit Type and Rh: 5100
Unit Type and Rh: 5100
Unit Type and Rh: 5100
Unit Type and Rh: 5100

## 2020-04-23 LAB — GLUCOSE, CAPILLARY: Glucose-Capillary: 133 mg/dL — ABNORMAL HIGH (ref 70–99)

## 2020-04-23 IMAGING — CT CT HEAD CODE STROKE
3 of 7 series · 14 of 47 positions shown, 17 images · non-contrast
Comparison: [DATE]

CLINICAL DATA: Code stroke.  Left-sided weakness

EXAM:
CT HEAD WITHOUT CONTRAST
TECHNIQUE: Contiguous axial images were obtained from the base of the skull
through the vertex without intravenous contrast.

[Series 4: head 2.0 h70h · axial · 0.42mm/px · z∈[-59,+69]mm · 9 of 81 slices shown, 12 images]
[im 9/81  brain]
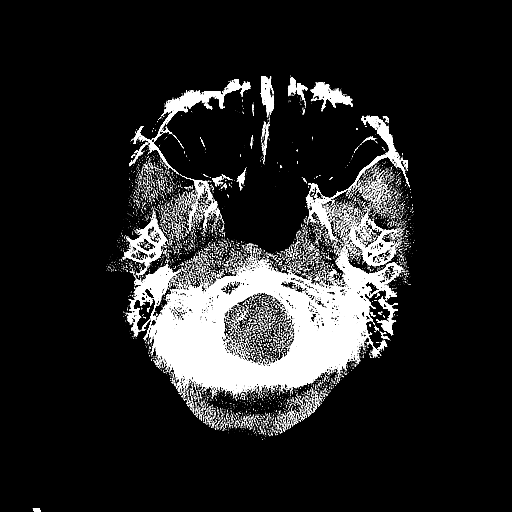
[im 9/81  bone]
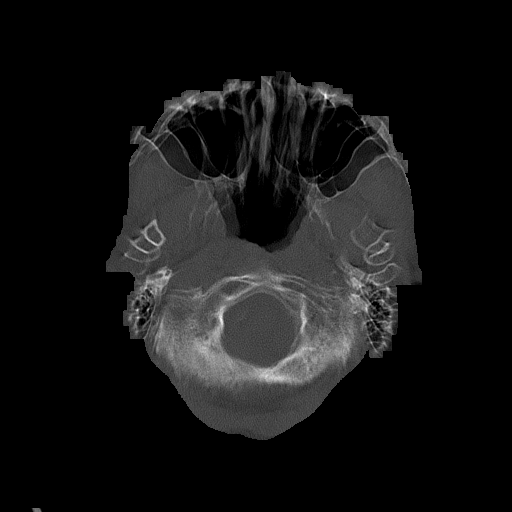
[im 17/81  brain]
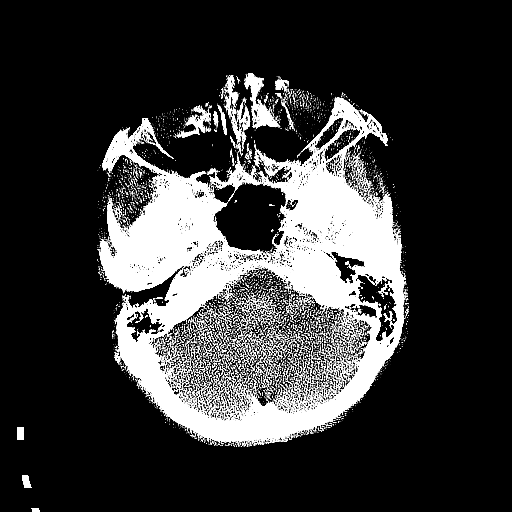
[im 25/81  brain]
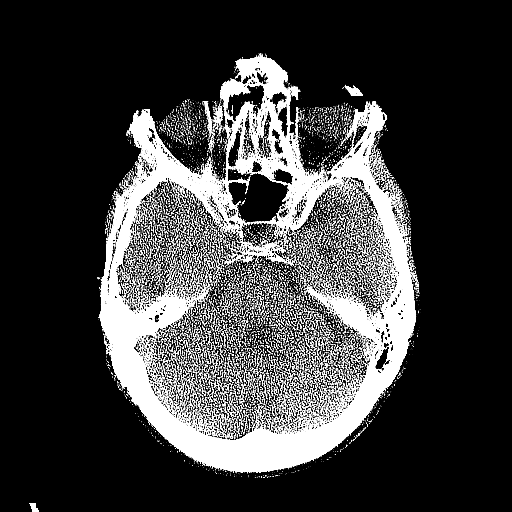
[im 33/81  brain]
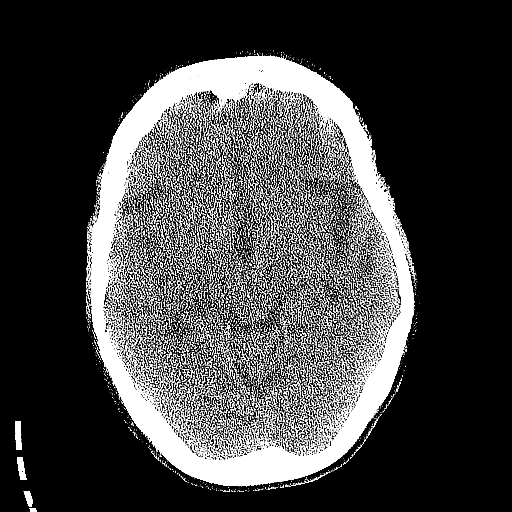
[im 41/81  brain]
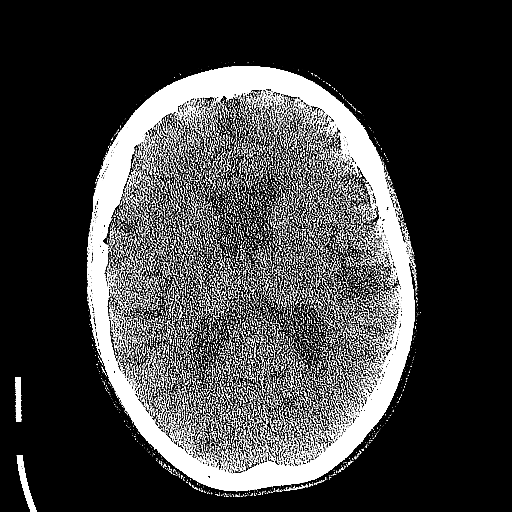
[im 41/81  bone]
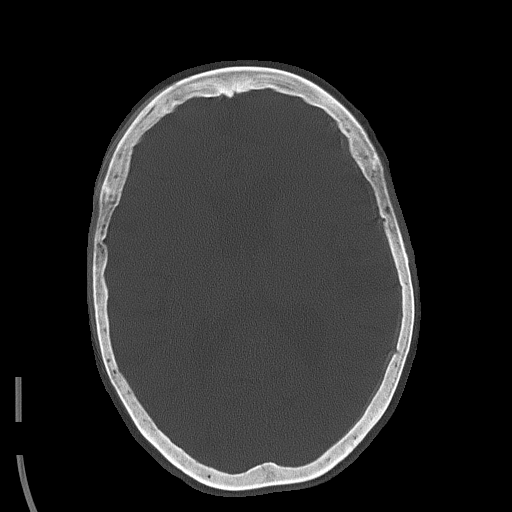
[im 49/81  brain]
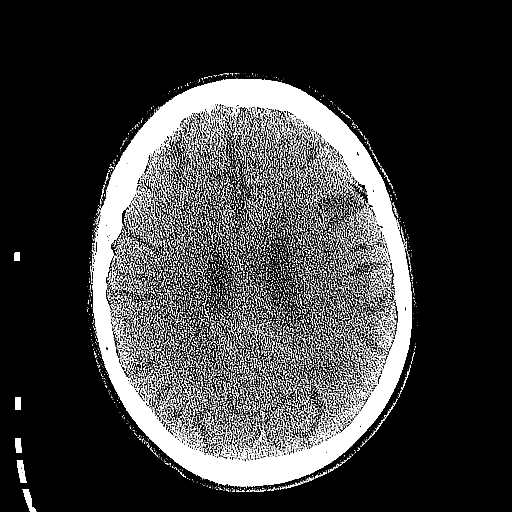
[im 57/81  brain]
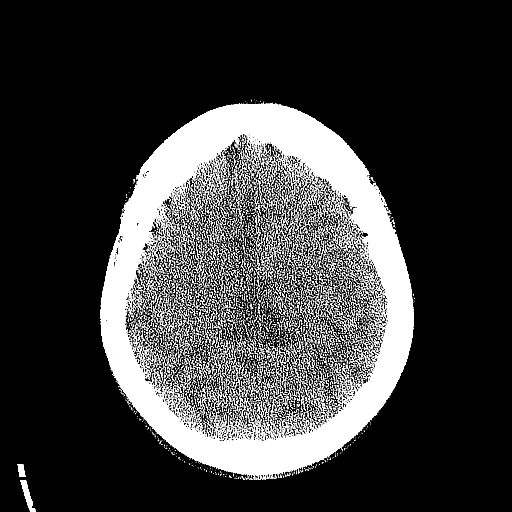
[im 65/81  brain]
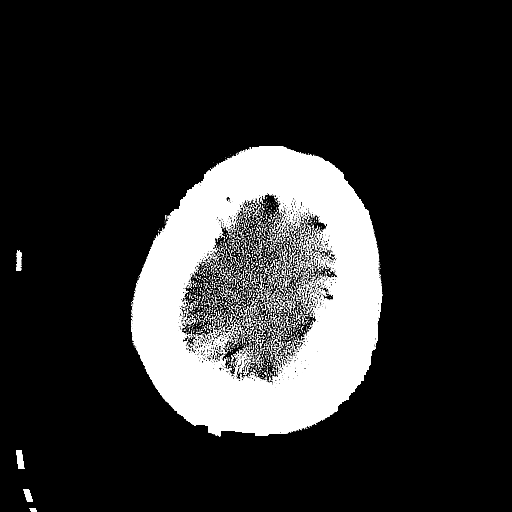
[im 73/81  brain]
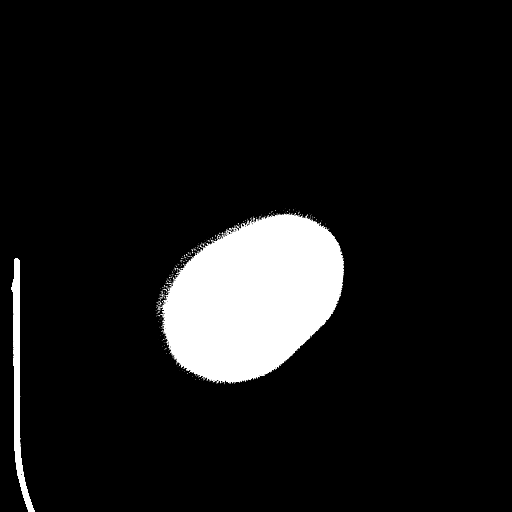
[im 73/81  bone]
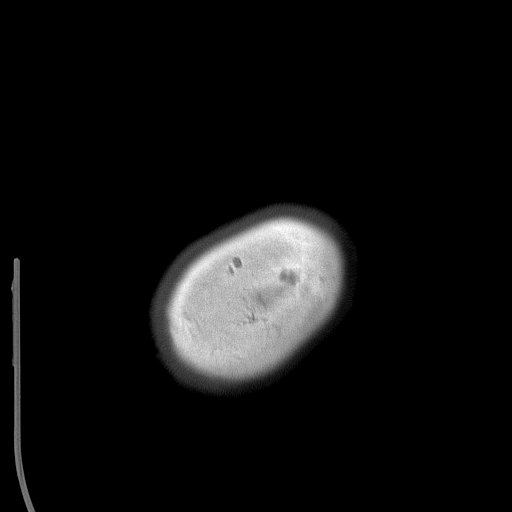

[Series 5: head 3.0 mpr cor · coronal · 0.31mm/px · 3 of 67 slices shown]
[im 23/67  brain]
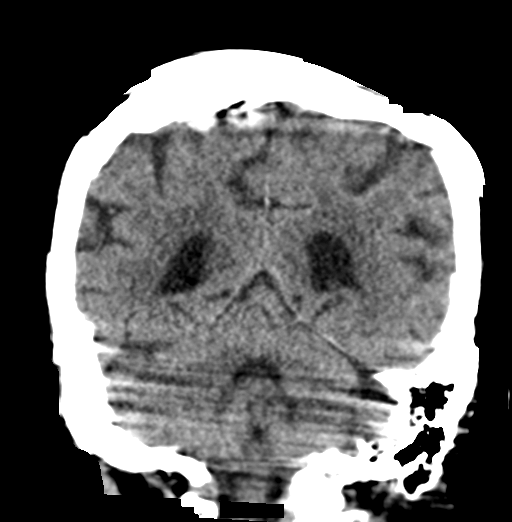
[im 30/67  brain]
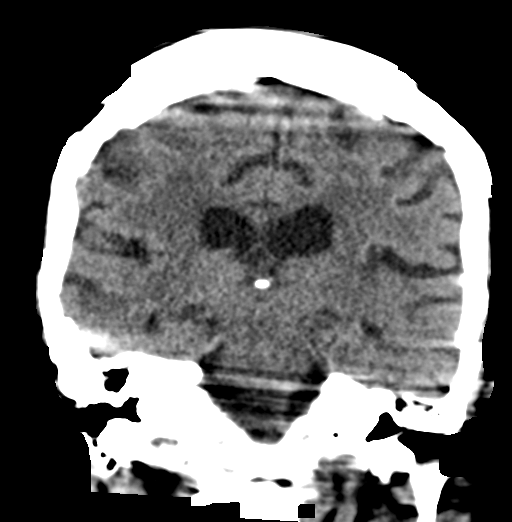
[im 37/67  brain]
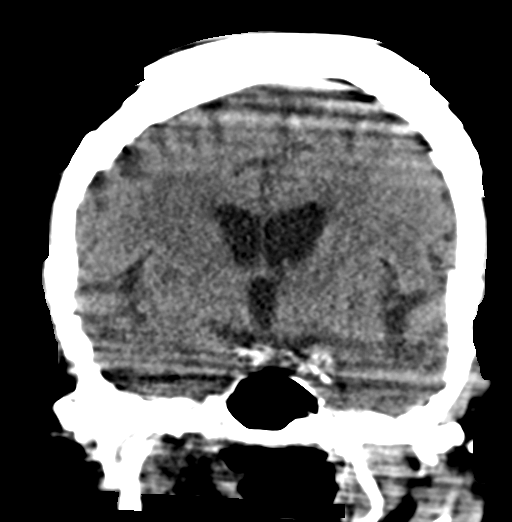

[Series 10: head 3.0 mpr sag · sagittal · 0.31mm/px · 2 of 51 slices shown]
[im 17/51  brain]
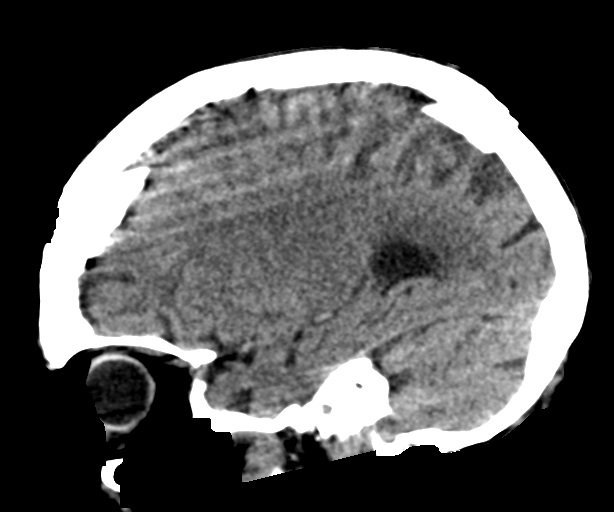
[im 34/51  brain]
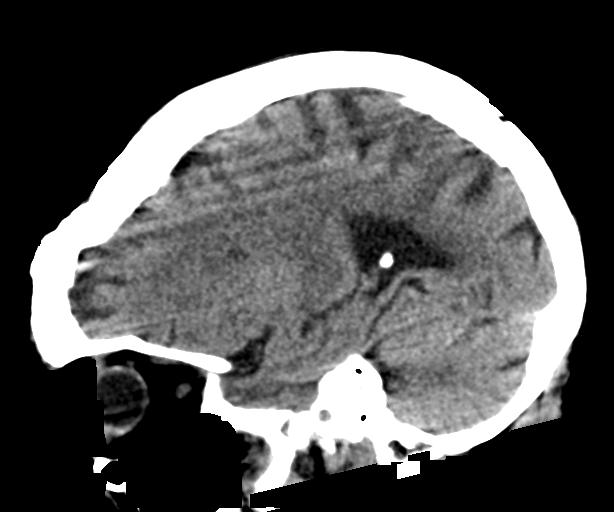

[14 of 47 positions shown; findings below may reference images not displayed]

FINDINGS: Examination is severely motion degraded.

Brain: There is no mass, hemorrhage or extra-axial collection. The
size and configuration of the ventricles and extra-axial CSF spaces
are normal. The brain parenchyma is normal, without evidence of
acute or chronic infarction.

Vascular: No abnormal hyperdensity of the major intracranial
arteries or dural venous sinuses. No intracranial atherosclerosis.

Skull: The visualized skull base, calvarium and extracranial soft
tissues are normal.

Sinuses/Orbits: No fluid levels or advanced mucosal thickening of
the visualized paranasal sinuses. No mastoid or middle ear effusion.
The orbits are normal.

ASPECTS (Alberta Stroke Program Early CT Score)

Cannot be fully scored due to degree of motion. Ganglionic level
score is [DATE].
IMPRESSION: Severely motion degraded study without acute intracranial
abnormality.

These results were communicated to Dr. JOSEPH TORRES at [DATE]
on [DATE] by telephone.

## 2020-04-23 IMAGING — CT CT ANGIO HEAD-NECK
3 of 9 series · 14 of 47 positions shown · IV contrast (omnipaque)
Comparison: None.

CLINICAL DATA: Left-sided weakness

EXAM:
CT ANGIOGRAPHY HEAD AND NECK
TECHNIQUE: Multidetector CT imaging of the head and neck was performed using
the standard protocol during bolus administration of intravenous
contrast. Multiplanar CT image reconstructions and MIPs were
obtained to evaluate the vascular anatomy. Carotid stenosis
measurements (when applicable) are obtained utilizing NASCET
criteria, using the distal internal carotid diameter as the
denominator.
CONTRAST:  75mL OMNIPAQUE IOHEXOL 350 MG/ML SOLN

[Series 9: arterial thin · axial · arterial · 0.50mm/px · z∈[-316,+29]mm · 8 of 815 slices shown]
[im 96/815  brain]
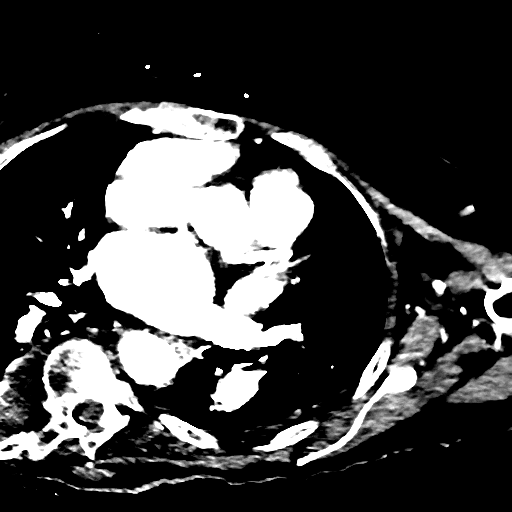
[im 192/815  bone]
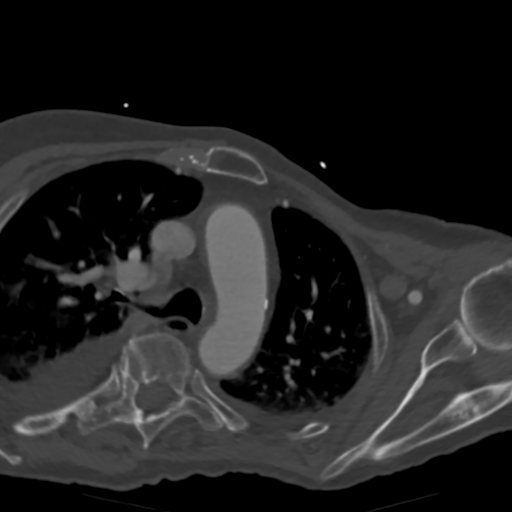
[im 288/815  brain]
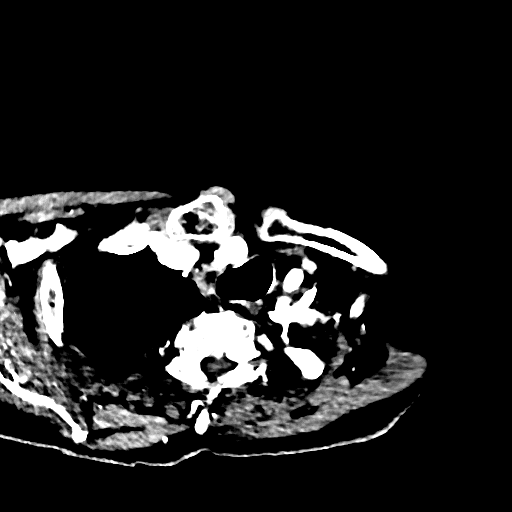
[im 384/815  bone]
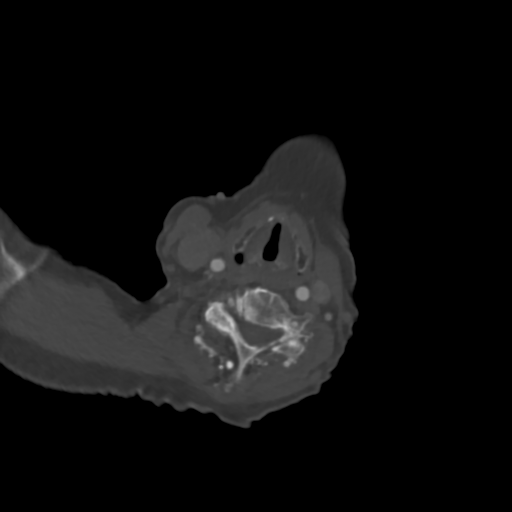
[im 479/815  brain]
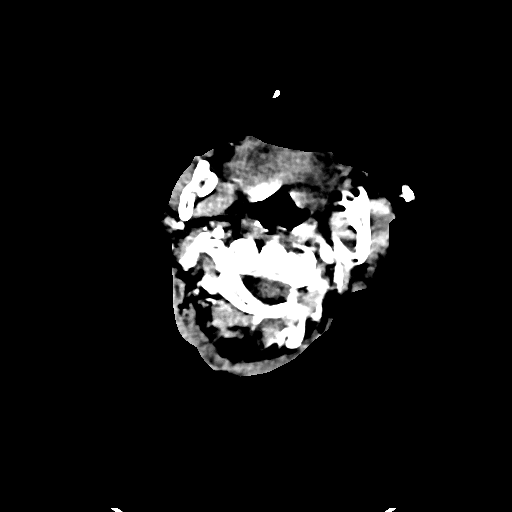
[im 575/815  bone]
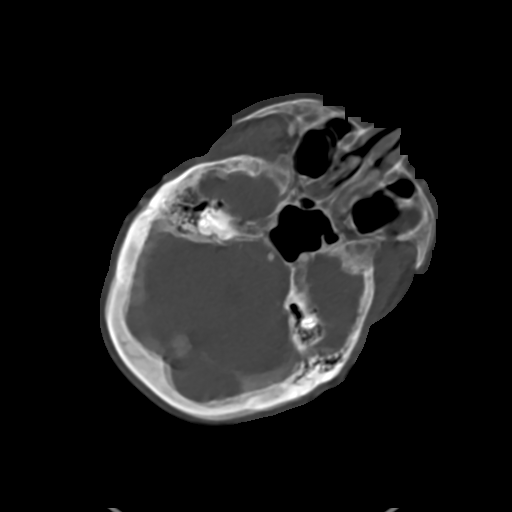
[im 671/815  brain]
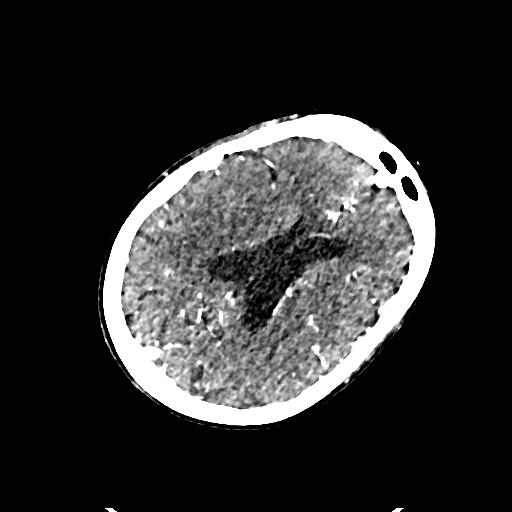
[im 767/815  bone]
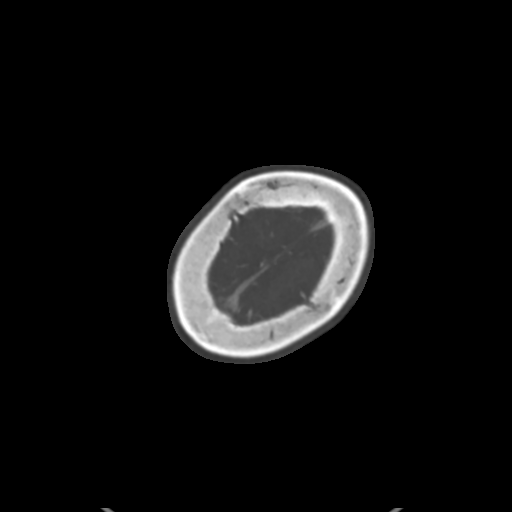

[Series 11: arterial cor thick · coronal · arterial · 0.53mm/px · 3 of 47 slices shown]
[im 16/47  brain]
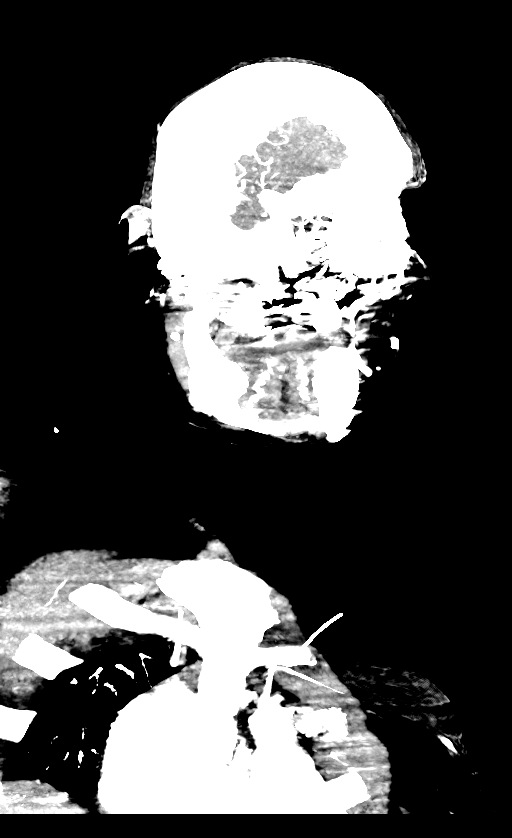
[im 21/47  brain]
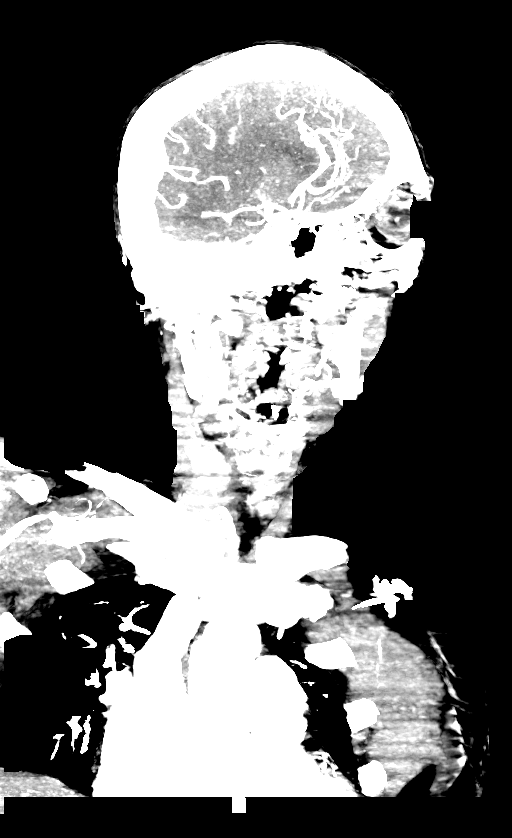
[im 26/47  brain]
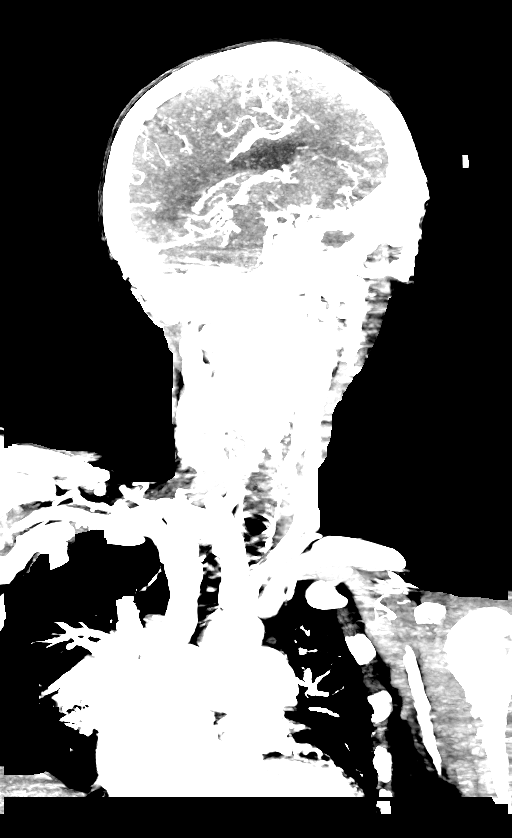

[Series 12: arterial sag thick · sagittal · arterial · 0.46mm/px · 3 of 61 slices shown]
[im 21/61  brain]
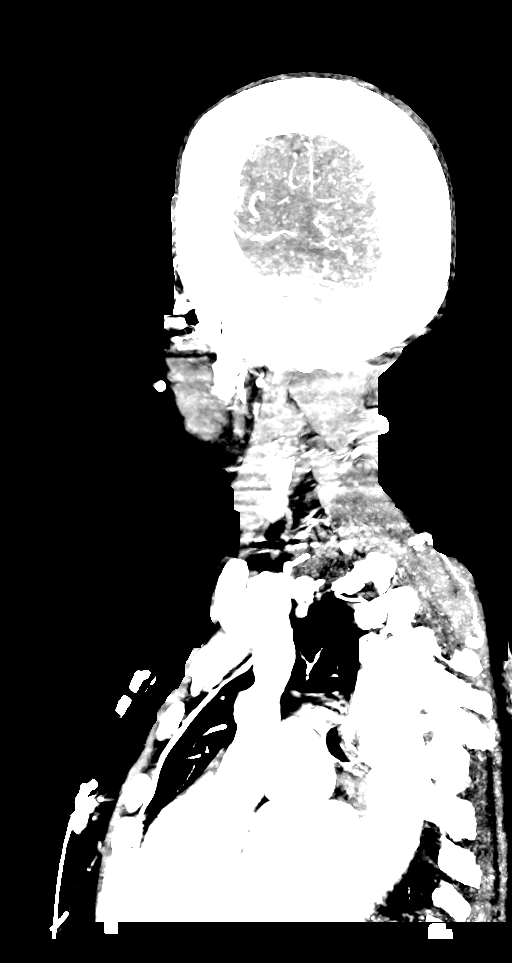
[im 31/61  brain]
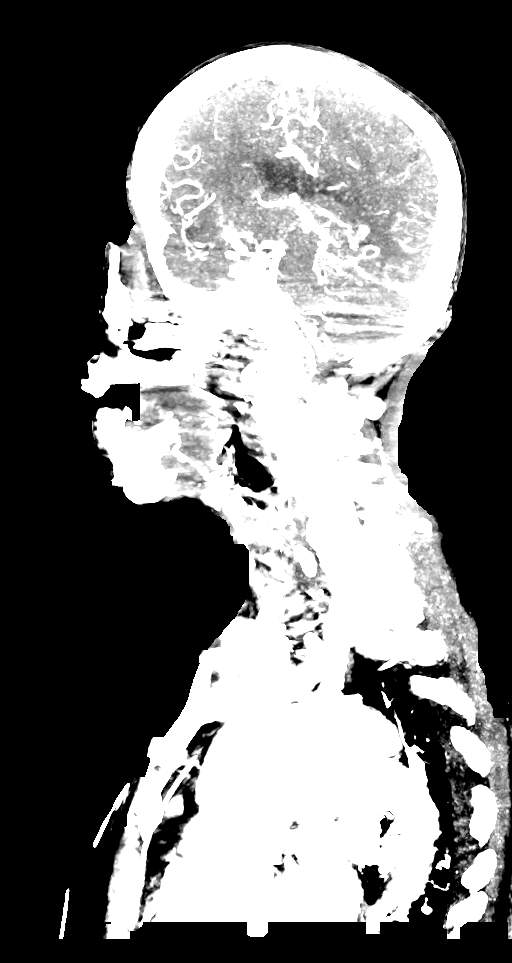
[im 41/61  brain]
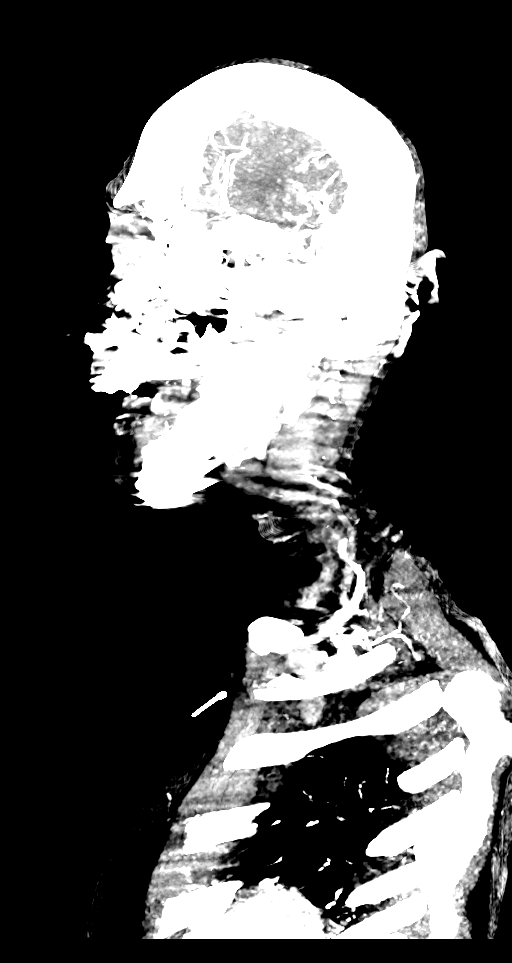

[14 of 47 positions shown; findings below may reference images not displayed]

FINDINGS: Motion degraded study.

CTA NECK FINDINGS

SKELETON: There is no bony spinal canal stenosis. No lytic or
blastic lesion.

OTHER NECK: Normal pharynx, larynx and major salivary glands. No
cervical lymphadenopathy. Unremarkable thyroid gland.

UPPER CHEST: No pneumothorax or pleural effusion. No nodules or
masses.

AORTIC ARCH:

There is mild calcific atherosclerosis of the aortic arch. There is
no aneurysm, dissection or hemodynamically significant stenosis of
the visualized portion of the aorta. Conventional 3 vessel aortic
branching pattern. The visualized proximal subclavian arteries are
widely patent.

RIGHT CAROTID SYSTEM: No dissection, occlusion or aneurysm. There is
calcified atherosclerosis extending into the proximal ICA, resulting
in 50% stenosis.

LEFT CAROTID SYSTEM: Normal without aneurysm, dissection or
stenosis.

VERTEBRAL ARTERIES: Codominant configuration. Both origins are
clearly patent. There is no dissection, occlusion or flow-limiting
stenosis to the skull base (V1-V3 segments).

CTA HEAD FINDINGS

POSTERIOR CIRCULATION:

--Vertebral arteries: Normal V4 segments.

--Inferior cerebellar arteries: Normal.

--Basilar artery: Normal.

--Superior cerebellar arteries: Normal.

--Posterior cerebral arteries (PCA): Normal.

ANTERIOR CIRCULATION:

--Intracranial internal carotid arteries: Normal.

--Anterior cerebral arteries (ACA): Normal. Both A1 segments are
present. Patent anterior communicating artery (a-comm).

--Middle cerebral arteries (MCA): There is multifocal occlusion of
the right middle cerebral artery, including the proximal M1 segment,
proximal M2 segment and distal M2. The left MCA is patent.

VENOUS SINUSES: As permitted by contrast timing, patent.

ANATOMIC VARIANTS: None

Review of the MIP images confirms the above findings.
IMPRESSION: 1. Multifocal occlusion of the right middle cerebral artery,
including proximal M1 segment occlusion.
2. No other intracranial occlusion.
3. 50% stenosis of the right internal carotid artery at its origin.
4.  Aortic atherosclerosis ([3M]-[3M]).

Critical Value/emergent results were called by telephone at the time
of interpretation on [DATE] at [DATE] to provider SOKRATOUS
SOKRATOUS , who verbally acknowledged these results.

## 2020-04-23 SURGERY — IR WITH ANESTHESIA
Anesthesia: General

## 2020-04-23 MED ORDER — ONDANSETRON HCL 4 MG/2ML IJ SOLN
INTRAMUSCULAR | Status: AC
  Administered 2020-04-23: 4 mg
  Filled 2020-04-23: qty 2

## 2020-04-23 MED ORDER — LORAZEPAM 2 MG/ML IJ SOLN
INTRAMUSCULAR | Status: AC
  Administered 2020-04-23: 0.5 mg via INTRAVENOUS
  Filled 2020-04-23: qty 1

## 2020-04-23 MED ORDER — CLEVIDIPINE BUTYRATE 0.5 MG/ML IV EMUL
INTRAVENOUS | Status: AC
  Administered 2020-04-24: 25 mg
  Filled 2020-04-23: qty 50

## 2020-04-23 MED ORDER — SODIUM CHLORIDE 0.9 % IV SOLN: INTRAVENOUS | Status: DC

## 2020-04-23 MED ORDER — IOHEXOL 350 MG/ML SOLN
75.0000 mL | Freq: Once | INTRAVENOUS | Status: AC | PRN
  Administered 2020-04-23: 75 mL via INTRAVENOUS

## 2020-04-23 MED ORDER — LORAZEPAM 2 MG/ML IJ SOLN: 1.0000 mg | Freq: Once | INTRAMUSCULAR | Status: DC

## 2020-04-23 MED ORDER — BISOPROLOL FUMARATE 5 MG PO TABS
2.5000 mg | ORAL_TABLET | Freq: Every day | ORAL | Status: DC
  Filled 2020-04-23: qty 0.5

## 2020-04-23 MED ORDER — DIPHENHYDRAMINE HCL 25 MG PO CAPS: 50.0000 mg | ORAL_CAPSULE | Freq: Once | ORAL | Status: AC

## 2020-04-23 MED ORDER — CEFAZOLIN SODIUM-DEXTROSE 2-4 GM/100ML-% IV SOLN
INTRAVENOUS | Status: AC
  Filled 2020-04-23: qty 100

## 2020-04-23 MED ORDER — LORAZEPAM 2 MG/ML IJ SOLN: 0.5000 mg | Freq: Once | INTRAMUSCULAR | Status: AC

## 2020-04-23 MED ORDER — DIPHENHYDRAMINE HCL 50 MG/ML IJ SOLN
50.0000 mg | Freq: Once | INTRAMUSCULAR | Status: AC
  Administered 2020-04-23: 50 mg via INTRAVENOUS
  Filled 2020-04-23: qty 1

## 2020-04-23 MED ORDER — HYDROCORTISONE NA SUCCINATE PF 250 MG IJ SOLR
200.0000 mg | Freq: Once | INTRAMUSCULAR | Status: AC
  Administered 2020-04-23: 200 mg via INTRAVENOUS
  Filled 2020-04-23: qty 200

## 2020-04-23 NOTE — Progress Notes (Signed)
SLP Cancellation Note  Patient Details Name: Victoria Oconnell MRN: [id number] DOB: April 26, 1930  Cancelled treatment:       Reason Eval/Treat Not Completed: Patient declined - she was eating breakfast and requested being allowed time to finish prior to completion of cognitive linguistic evaluation. SLP attempted again and pt was on the phone. Sitter reported they were going to go to the bathroom upon completion of the phone call. Will continue efforts.  Keiffer Piper B. Murvin Natal, [facility], CCC-SLP Speech Language Pathologist Office: [phone]  Leigh Aurora 04/08/2020, 10:21 AM

## 2020-04-23 NOTE — Progress Notes (Signed)
Progress Note    Victoria Oconnell  RKY:[id number] DOB: 12-03-1929  DOA: 2020-04-28 PCP: Alben Deeds, MD    Brief Narrative:     Medical records reviewed and are as summarized below:  Victoria Oconnell is an 86 y.o. female with medical history significant of afib, previously on Xarelto multiple recent hospitalizations for GI bleeds presenting with the same; Eliquis was stopped 1 week ago for this reason.  She has been having bloody stools.  She has had 2 recent hospital admissions in [place] - 3 weeks ago with scope, on Xarelto for afib, no source identified; she began bleeding again at home and so readmitted and Hgb was 4.9.  She had been inadvertently taking Xarelto.  Scoped twice, found a polyp and diverticulosis.  Were considering a Watchman due to afib and considering a laparoscopic procedure.  Her DIL works for [employer] here and so they were working on that.  She was discharged on Saturday of last weekend and followed up with PCP.  Hgb was 9.5 at d/c and above 8 Tuesday at PCP f/u.  Last night, she felt weak, tired, BP was 93/62.  She felt felt lightheaded.  She missed her wheelchair during transfer and so they decided to bring her in to [facility].  Initially had EGD and then had 3 subsequent colonoscopies.  She is off the Xarelto due to GI bleeding.  Had EGD/colon    Assessment/Plan:   Principal Problem:   Acute GI bleeding Active Problems:   Essential hypertension   Diverticulosis   CHF (congestive heart failure) (HCC)   CAD (coronary artery disease)   Atrial fibrillation (HCC)   Asthma   Dependence on nocturnal oxygen therapy   Dementia (HCC)   Anemia due to blood loss   Cerebral embolism with cerebral infarction   New embolic CVAs like to due to a fib (she can not have anticoagulation) -patient had an abrupt mental status change on 9/26 around 11 AM -echo done but pending read -MRI: New area of acute/subacute cortical/subcortical ischemia predominantly within the left parietal  lobe -neuro consult appreciated  Neurologic changes: expressive aphasia -due to CVA from a fib  GI Bleeding with ABLA -Patient with 2 prior recent hospitalizations with reported EGD x 1 and colonoscopy x 3 showing diverticulosis -She was previously on Xarelto but this has been stopped; her last dose was over a week ago and possibly 2 weeks ago -s/p 2 units PRBC -no recurrent bleeding -GI consult appreciated: s/p colonoscopy:  Diverticulosis in the sigmoid colon, no source of bleeding found.   -Capsule endoscopy was also unrevealing of site of bleeding  Afib -Patient with long-standing afib -reported to be bradycardic-we will continue telemetry -Xarelto has been on hold since about 9/11 and due to bleeding, she is no longer a candidate for  -Resume dig: resume bisoprolol at a lower dose  Asthma, on nocturnal O2 -Continue Albuterol -She has been reportedly taking both Symbicort and Flovent, which appears to be redundant -Will order Dulera now (formulary substitution for Symbicort) -Continue nocturnal O2 (although it is not entirely clear why she needs this at this time)  Dementia -It is not clear how much this has been discussed with patient/family, although her daughter-in-law does acknowledge mild cognitive impairment -now with expressive aphasia  Hypothyroidism -Continue Synthroid at current dose for now -outpatient follow up  HTN -Hold Cozaar for now  Renal dysfunction --monitor  CAD -Continue Pravachol -She has prior remote stent  CHF -Uncertain whether systolic or diastolic since no  records are available -Takes ACE/BB/Lasix at home -Appears to be compensated at this time -resume lasix as able   ADDENDUM: - prescription for prozac 40mg /day (90 day supply) was dispensed on 11/18/19 from [place]. A prescription for prozac 20mg  po daily was sent to [facility] in September but it was never picked up.    Family Communication/Anticipated D/C date and  plan/Code Status   DVT prophylaxis: scd Code Status: DNR per family Disposition Plan: Status is: Inpatient Spoke with daughter in law several times )- has a medical background Remains inpatient appropriate because:Inpatient level of care appropriate due to severity of illness   Dispo: The patient is from: Home              Anticipated d/c is to: SNF              Anticipated d/c date is: 2 days              Patient currently is not medically stable to d/c.  Needs stroke work-up         Medical Consultants:   GI neurology    Subjective:   Appears frustrated with speech but able to tell me after a pause where she fills her medications   Objective:    Vitals:   13-May-2020 0941 May 13, 2020 1153 13-May-2020 1157 13-May-2020 1528  BP: (!) 150/68  (!) 168/77 107/89  Pulse:   60 85  Resp:   18 (!) 23  Temp: (!) 97.5 F (36.4 C) 98.7 F (37.1 C) 98.7 F (37.1 C) 97.8 F (36.6 C)  TempSrc: Oral Oral Oral Oral  SpO2:   98% 96%  Weight:      Height:        Intake/Output Summary (Last 24 hours) at 13-May-2020 1602 Last data filed at 2020/05/13 04/14/2020 Gross per 24 hour  Intake 240 ml  Output --  Net 240 ml   Filed Weights   03/28/2020 0709 May 13, 2020 0253  Weight: 61.2 kg 64 kg    Exam:   General: Appearance:    Well developed, well nourished female in no acute distress     Lungs:      respirations unlabored  Heart:    Normal heart rate. irr No murmurs, rubs, or gallops. (tele with a short pause)   MS:   All extremities are intact.   Neurologic:   Awake, alert- having difficulty speaking      Data Reviewed:   I have personally reviewed following labs and imaging studies:  Labs: Labs show the following:   Basic Metabolic Panel: Recent Labs  Lab 04/19/20 0000 04/19/20 0000 04/01/2020 0633 04/09/2020 0633 04/21/20 0530 04/22/20 0321  NA 132*  --  139  --  133* 133*  K 4.7   < > 4.2   < > 3.8 4.2  CL 95*  --  105  --  102 102  CO2 27  --  27  --  25 24   GLUCOSE 119*  --  89  --  95 95  BUN 21  --  13  --  10 10  CREATININE 1.25*  --  1.07*  --  0.95 0.97  CALCIUM 9.1  --  8.8*  --  8.4* 8.6*   < > = values in this interval not displayed.   GFR Estimated Creatinine Clearance: 36.8 mL/min (by C-G formula based on SCr of 0.97 mg/dL). Liver Function Tests: No results for input(s): AST, ALT, ALKPHOS, BILITOT, PROT, ALBUMIN in the last 168  hours. No results for input(s): LIPASE, AMYLASE in the last 168 hours. No results for input(s): AMMONIA in the last 168 hours. Coagulation profile Recent Labs  Lab 04/19/20 0000  INR 1.0    CBC: Recent Labs  Lab 04/19/20 1649 04/22/2020 0633 03/29/2020 1641 04/21/20 0530 04/22/20 0321  WBC 6.9 6.7 8.4 7.5 7.8  HGB 9.9* 10.0* 9.7* 9.0* 9.6*  HCT 30.6* 31.8* 31.3* 28.4* 30.8*  MCV 89.5 89.6 89.9 91.0 89.3  PLT 336 323 325 346 369   Cardiac Enzymes: No results for input(s): CKTOTAL, CKMB, CKMBINDEX, TROPONINI in the last 168 hours. BNP (last 3 results) No results for input(s): PROBNP in the last 8760 hours. CBG: Recent Labs  Lab 04/21/20 1122  GLUCAP 100*   D-Dimer: No results for input(s): DDIMER in the last 72 hours. Hgb A1c: Recent Labs    04/21/20 0530  HGBA1C 5.3   Lipid Profile: Recent Labs    04/21/20 0530  CHOL 105  HDL 39*  LDLCALC 54  TRIG 58  CHOLHDL 2.7   Thyroid function studies: No results for input(s): TSH, T4TOTAL, T3FREE, THYROIDAB in the last 72 hours.  Invalid input(s): FREET3 Anemia work up: No results for input(s): VITAMINB12, FOLATE, FERRITIN, TIBC, IRON, RETICCTPCT in the last 72 hours. Sepsis Labs: Recent Labs  Lab 04/03/2020 1610 03/28/2020 1641 04/21/20 0530 04/22/20 0321  WBC 6.7 8.4 7.5 7.8    Microbiology Recent Results (from the past 240 hour(s))  Respiratory Panel by RT PCR (Flu A&B, Covid) - Nasopharyngeal Swab     Status: None   Collection Time: 04/19/20  6:33 AM   Specimen: Nasopharyngeal Swab  Result Value Ref Range Status    SARS Coronavirus 2 by RT PCR NEGATIVE NEGATIVE Final    Comment: (NOTE) SARS-CoV-2 target nucleic acids are NOT DETECTED.  The SARS-CoV-2 RNA is generally detectable in upper respiratoy specimens during the acute phase of infection. The lowest concentration of SARS-CoV-2 viral copies this assay can detect is 131 copies/mL. A negative result does not preclude SARS-Cov-2 infection and should not be used as the sole basis for treatment or other patient management decisions. A negative result may occur with  improper specimen collection/handling, submission of specimen other than nasopharyngeal swab, presence of viral mutation(s) within the areas targeted by this assay, and inadequate number of viral copies (<131 copies/mL). A negative result must be combined with clinical observations, patient history, and epidemiological information. The expected result is Negative.  Fact Sheet for Patients:  https://www.moore.com/  Fact Sheet for Healthcare Providers:  https://www.young.biz/  This test is no t yet approved or cleared by the [place] FDA and  has been authorized for detection and/or diagnosis of SARS-CoV-2 by FDA under an Emergency Use Authorization (EUA). This EUA will remain  in effect (meaning this test can be used) for the duration of the COVID-19 declaration under Section 564(b)(1) of the Act, 21 U.S.C. section 360bbb-3(b)(1), unless the authorization is terminated or revoked sooner.     Influenza A by PCR NEGATIVE NEGATIVE Final   Influenza B by PCR NEGATIVE NEGATIVE Final    Comment: (NOTE) The Xpert Xpress SARS-CoV-2/FLU/RSV assay is intended as an aid in  the diagnosis of influenza from Nasopharyngeal swab specimens and  should not be used as a sole basis for treatment. Nasal washings and  aspirates are unacceptable for Xpert Xpress SARS-CoV-2/FLU/RSV  testing.  Fact Sheet for  Patients: https://www.moore.com/  Fact Sheet for Healthcare Providers: https://www.young.biz/  This test is not yet approved or cleared  by the [place] and  has been authorized for detection and/or diagnosis of SARS-CoV-2 by  FDA under an Emergency Use Authorization (EUA). This EUA will remain  in effect (meaning this test can be used) for the duration of the  Covid-19 declaration under Section 564(b)(1) of the Act, 21  U.S.C. section 360bbb-3(b)(1), unless the authorization is  terminated or revoked. Performed at [facility], [address], [place], [place] [address]     Procedures and diagnostic studies:  DG Abd 1 View  Result Date: 04/22/2020 CLINICAL DATA:  Possible retained and ostomy capsule EXAM: ABDOMEN - 1 VIEW COMPARISON:  None. FINDINGS: Scattered large and small bowel gas is noted. No abnormal mass or abnormal calcifications are seen. No radiopaque foreign body is noted. Changes of scoliosis in the thoracolumbar spine and multiple pubic rami fractures are noted. IMPRESSION: No evidence of retained foreign body. Electronically Signed   By: Alcide Clever M.D.   On: 04/22/2020 16:55   MR BRAIN WO CONTRAST  Result Date: 04/22/2020 CLINICAL DATA:  Stroke follow-up.  Memory loss. EXAM: MRI HEAD WITHOUT CONTRAST TECHNIQUE: Multiplanar, multiecho pulse sequences of the brain and surrounding structures were obtained without intravenous contrast. COMPARISON:  Brain MRI 04/21/2020 FINDINGS: An abbreviated protocol was performed including axial and coronal diffusion-weighted imaging and axial T2 FLAIR sequence. There is a new area of abnormal diffusion restriction primarily within the left parietal lobe and mostly cortical/subcortical. No hemorrhage or mass effect. Unchanged moderate periventricular white matter hyperintensity. IMPRESSION: New area of acute/subacute cortical/subcortical ischemia predominantly within the left parietal  lobe. Electronically Signed   By: Deatra Robinson M.D.   On: 04/22/2020 20:32   EEG adult  Result Date: 04/22/2020 Charlsie Quest, MD     04/22/2020 10:21 AM Patient Name: Maayan Jenning MRN: [id number] Epilepsy Attending: Charlsie Quest Referring Physician/Provider: Dr Milon Dikes Date: 04/22/2020 Duration: 23.40 mins Patient history: 86 year old female with sudden onset of change in mentation.  EEG to evaluate for seizures.  EEG to evaluate for seizure. Level of alertness: Awake, asleep AEDs during EEG study: None Technical aspects: This EEG study was done with scalp electrodes positioned according to the 10-20 International system of electrode placement. Electrical activity was acquired at a sampling rate of 500Hz  and reviewed with a high frequency filter of 70Hz  and a low frequency filter of 1Hz . EEG data were recorded continuously and digitally stored. Description: The posterior dominant rhythm consists of 8-9 Hz activity of moderate voltage (25-35 uV) seen predominantly in posterior head regions, symmetric and reactive to eye opening and eye closing. Sleep was characterized by vertex waves, sleep spindles (12 to 14 Hz), maximal frontocentral region.  EEG showed continuous generalized 3 to 6 Hz theta-delta slowing.  Hyperventilation and photic stimulation were not performed.   ABNORMALITY -Continuous slow, generalized IMPRESSION: This study is suggestive of mild diffuse encephalopathy, nonspecific etiology. No seizures or epileptiform discharges were seen throughout the recording. Priyanka O Yadav   VAS CAROTID  Result Date: 04/22/2020 Carotid Arterial Duplex Study Indications:       CVA, Speech disturbance and Ataxia. Risk Factors:      Hypertension, coronary artery disease. Other Factors:     A-fib. Comparison Study:  No prior study on file Performing Technologist: RVS  Examination Guidelines: A complete evaluation includes B-mode imaging, spectral Doppler, color Doppler, and power  Doppler as needed of all accessible portions of each vessel. Bilateral testing is considered an integral part of a complete  examination. Limited examinations for reoccurring indications may be performed as noted.  Right Carotid Findings: +----------+--------+--------+--------+------------------+---------+           PSV cm/sEDV cm/sStenosisPlaque DescriptionComments  +----------+--------+--------+--------+------------------+---------+ CCA Prox  75      14              heterogenous                +----------+--------+--------+--------+------------------+---------+ CCA Distal66      12              heterogenous                +----------+--------+--------+--------+------------------+---------+ ICA Prox  75      16              calcific          Shadowing +----------+--------+--------+--------+------------------+---------+ ICA Distal76      22                                          +----------+--------+--------+--------+------------------+---------+ ECA       83      16                                          +----------+--------+--------+--------+------------------+---------+ +----------+--------+-------+--------+-------------------+           PSV cm/sEDV cmsDescribeArm Pressure (mmHG) +----------+--------+-------+--------+-------------------+ [id number]                                         +----------+--------+-------+--------+-------------------+ +---------+--------+--+--------+--+ VertebralPSV cm/s52EDV cm/s15 +---------+--------+--+--------+--+  Left Carotid Findings: +----------+--------+--------+--------+------------------+--------+           PSV cm/sEDV cm/sStenosisPlaque DescriptionComments +----------+--------+--------+--------+------------------+--------+ CCA Prox  84      16              homogeneous                +----------+--------+--------+--------+------------------+--------+ CCA Distal73      18               heterogenous               +----------+--------+--------+--------+------------------+--------+ ICA Prox  56      18              heterogenous               +----------+--------+--------+--------+------------------+--------+ ICA Distal82      27                                         +----------+--------+--------+--------+------------------+--------+ ECA       84      12                                         +----------+--------+--------+--------+------------------+--------+ +----------+--------+--------+--------+-------------------+           PSV cm/sEDV cm/sDescribeArm Pressure (mmHG) +----------+--------+--------+--------+-------------------+ [id number]                                          +----------+--------+--------+--------+-------------------+ +---------+--------+--+--------+--+  VertebralPSV cm/s40EDV cm/s14 +---------+--------+--+--------+--+   Summary: Right Carotid: Velocities in the right ICA are consistent with a 1-39% stenosis. Left Carotid: Velocities in the left ICA are consistent with a 1-39% stenosis. Vertebrals:  Bilateral vertebral arteries demonstrate antegrade flow. Subclavians: Normal flow hemodynamics were seen in bilateral subclavian              arteries. *See table(s) above for measurements and observations.  Electronically signed by Delia HeadyPramod Sethi MD on 04/22/2020 at 12:53:42 PM.    Final     Medications:   . bisoprolol  5 mg Oral Daily  . digoxin  0.125 mg Oral Daily  . FLUoxetine  20 mg Oral Daily  . levothyroxine  88 mcg Oral QAC breakfast  . mometasone-formoterol  2 puff Inhalation BID  . pantoprazole (PROTONIX) IV  40 mg Intravenous Q24H  . pravastatin  40 mg Oral QHS   Continuous Infusions: . sodium chloride 20 mL/hr at 04/22/20 0010     LOS: 4 days   Joseph ArtJessica U Desa Rech  [facility]   How to contact the [facility] Attending or Consulting provider 7A - 7P or covering provider during after hours 7P -7A, for this  patient?  1. Check the care team in [facility] and look for a) attending/consulting [facility] provider listed and b) the [facility] team listed 2. Log into [email or web address] and use [place]'s universal password to access. If you do not have the password, please contact the hospital operator. 3. Locate the [facility] provider you are looking for under [facility] and page to a number that you can be directly reached. 4. If you still have difficulty reaching the provider, please page the [facility] (Director on Call) for the Hospitalists listed on amion for assistance.  04/15/2020, 4:02 PM

## 2020-04-23 NOTE — Progress Notes (Signed)
Pharmacy note prozac  Pharmacy was asked to look into the prescribing history for prozac.  A prescription for prozac 40mg /day (90 day supply) was dispensed on 11/18/19 from [facility]. A prescription for prozac 20mg  po daily was sent to [facility] in September but it was never picked up.  Summary -It appears that she should be on prozac but has not picked up a recent prescription -If there are further questions please call pharmacy [phone])  October, PharmD Clinical Pharmacist **Pharmacist phone directory can now be found on [email or web address] ([email or web address]).  Listed under [facility].

## 2020-04-23 NOTE — Progress Notes (Signed)
Physical Therapy Treatment Patient Details Name: Victoria Oconnell MRN: [id number] DOB: Mar 24, 1930 Today's Date: 04/15/2020    History of Present Illness 86 y.o. female presenting with GI bleed with ABLA. Pt with abrupt onset of impaired speech and mentation on 9/26, MRI + for acute/subacute infarcts involving L frontal and periventricular white matter. Per medical chart, patient with multiple recent hospital admissions for GI bleeds. PMHx significant for CAD w/ stent placement, CHF, diverticulosis, nocturnal O2 dependence, and A-fib.     PT Comments    Pt was seen for mobility with initial reluctance, but finally agreed to walk.  Had a RW that was too tall, adjusted for appropriate fit.  Her family member was in attendance, and talked with pt and PT about L shoulder pain complaints.  Has been consistently getting up with S, but used min guard support to maintain safety with RW in her room.  Heat and repositioning on L shoulder for support, pain alleviated and nursing in to assess.  Follow acutely for progression of gait as her pain and cognition support.   Follow Up Recommendations  SNF;Supervision/Assistance - 24 hour     Equipment Recommendations  3in1 (PT)    Recommendations for Other Services       Precautions / Restrictions Precautions Precautions: Fall Precaution Comments: L shoulder pain Restrictions Weight Bearing Restrictions: No    Mobility  Bed Mobility               General bed mobility comments: up in chair when PT arrived  Transfers Overall transfer level: Needs assistance Equipment used: Rolling walker (2 wheeled);1 person hand held assist Transfers: Sit to/from Stand Sit to Stand: Min guard Stand pivot transfers: Min guard       General transfer comment: min guard with RW to correctly transition hands  Ambulation/Gait Ambulation/Gait assistance: Min guard Gait Distance (Feet): 35 Feet Assistive device: Rolling walker (2 wheeled) Gait  Pattern/deviations: Step-through pattern;Decreased stride length;Wide base of support Gait velocity: reduced Gait velocity interpretation: <1.8 ft/sec, indicate of risk for recurrent falls General Gait Details: pt requires some cuing to manage RW from home.   Stairs             Wheelchair Mobility    Modified Rankin (Stroke Patients Only)       Balance Overall balance assessment: Needs assistance Sitting-balance support: Feet supported Sitting balance-Leahy Scale: Good     Standing balance support: Bilateral upper extremity supported;During functional activity Standing balance-Leahy Scale: Poor                              Cognition Arousal/Alertness: Awake/alert Behavior During Therapy: Flat affect Overall Cognitive Status: History of cognitive impairments - at baseline Area of Impairment: Problem solving;Safety/judgement;Awareness;Following commands;Memory;Attention;Orientation                 Orientation Level: Situation;Time Current Attention Level: Selective   Following Commands: Follows one step commands inconsistently;Follows one step commands with increased time Safety/Judgement: Decreased awareness of safety;Decreased awareness of deficits Awareness: Intellectual Problem Solving: Slow processing;Decreased initiation;Difficulty sequencing;Requires verbal cues;Requires tactile cues General Comments: Pt is struggling to localize time when L shoulder started hurting      Exercises      General Comments General comments (skin integrity, edema, etc.): Pt asking to be assisted with L shoulder pain and so repositioned with pillows and used hot pack      Pertinent Vitals/Pain Pain Assessment: Faces Faces Pain Scale: Hurts even  more Pain Location: left shoulder Pain Descriptors / Indicators: Aching;Sore Pain Intervention(s): Limited activity within patient's tolerance;Monitored during session;Premedicated before session;Repositioned     Home Living     Available Help at Discharge: Family;Available 24 hours/day Type of Home: House              Prior Function            PT Goals (current goals can now be found in the care plan section) Acute Rehab PT Goals Patient Stated Goal: to relieve L shoulder pain Progress towards PT goals: Progressing toward goals    Frequency    Min 3X/week      PT Plan Current plan remains appropriate    Co-evaluation              AM-PAC PT "6 Clicks" Mobility   Outcome Measure  Help needed turning from your back to your side while in a flat bed without using bedrails?: None Help needed moving from lying on your back to sitting on the side of a flat bed without using bedrails?: A Little Help needed moving to and from a bed to a chair (including a wheelchair)?: A Little Help needed standing up from a chair using your arms (e.g., wheelchair or bedside chair)?: A Little Help needed to walk in hospital room?: A Little Help needed climbing 3-5 steps with a railing? : A Little 6 Click Score: 19    End of Session Equipment Utilized During Treatment: Gait belt Activity Tolerance: Patient tolerated treatment well Patient left: with call bell/phone within reach;in chair;with chair alarm set;with nursing/sitter in room Nurse Communication: Mobility status PT Visit Diagnosis: Other abnormalities of gait and mobility (R26.89);Muscle weakness (generalized) (M62.81);Pain Pain - Right/Left: Left Pain - part of body: Shoulder     Time: 1510-1531 PT Time Calculation (min) (ACUTE ONLY): 21 min  Charges:  $Gait Training: 8-22 mins                    Ivar Drape 04/17/2020, 4:09 PM  Samul Dada, PT MS Acute Rehab Dept. Number: [facility] [phone] and [facility] [phone]

## 2020-04-23 NOTE — Progress Notes (Addendum)
NEUROLOGY CONSULTATION NOTE   Date of service: 2020/05/02 Patient Name: Victoria Oconnell MRN:  [id number] DOB:  09-15-1929 Reason for consult: "Stroke code"  History of Present Illness  Victoria Oconnell is a 86 y.o. 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 She was noted to be able to walk to the bathroom by herself and noted to have had improvement in aphasia over the last 2 days and was able to understand simple questions and follow commands and speak in short phrases and a few words.  A stroke code was activated. Given her recent stroke, she was not a candidate for tPA. CTH was motion degraded but was negative for a large hypodensity concerning for a large territory infarct or hyperdensity concerning for an ICH. Given allergy to contrast and patient unable to sit still in the CT, she had to be premedicated with hydrocortisone and diphenhydramine and had to be given Ativan to be able to get good quality images. CT Angio demonstrated multifocal occlusion of the R MCA including proximal R M1 occlusion, 50% stenosis of the R ICA.  NIHSS: 19 for neglect, L sided weakness, gaze deviation, sensory deficit, dysarthria and aphasia. MRS: 1 TPA: not given due to recent stroke Thrombectomy: Discussed with patient's children Victoria Oconnell, Victoria Oconnell and Victoria Oconnell and they understand the benefits and risks and agreed to it.   ROS   Unable to obtain due to the acuity of the situation and her aphasia.  Past History   Past Medical History:  Diagnosis Date  . Asthma   . Atrial fibrillation (HCC)   . CAD (coronary artery disease)    has stent  . CHF (congestive heart failure) (HCC)   . Dementia (HCC)    mild  . Dependence on nocturnal oxygen therapy    uncertain etiology  . Diverticulosis   . Essential hypertension   . GI bleeding  04/19/2020   Past Surgical History:  Procedure Laterality Date  . ABDOMINAL HYSTERECTOMY    . BREAST LUMPECTOMY    . COLONOSCOPY WITH PROPOFOL N/A 04/24/2020   Procedure: COLONOSCOPY WITH PROPOFOL;  Surgeon: Jeani Hawking, MD;  Location: [facility] ENDOSCOPY;  Service: Endoscopy;  Laterality: N/A;  . ENTEROSCOPY N/A 04/14/2020   Procedure: ENTEROSCOPY;  Surgeon: Jeani Hawking, MD;  Location: [facility] ENDOSCOPY;  Service: Endoscopy;  Laterality: N/A;  . GIVENS CAPSULE STUDY N/A 03/29/2020   Procedure: GIVENS CAPSULE STUDY;  Surgeon: Jeani Hawking, MD;  Location: [facility] ENDOSCOPY;  Service: Endoscopy;  Laterality: N/A;   History reviewed. No pertinent family history. Social History   Socioeconomic History  . Marital status: Widowed    Spouse name: Not on file  . Number of children: Not on file  . Years of education: Not on file  . Highest education level: Not on file  Occupational History  . Occupation: retired  Tobacco Use  . Smoking status: Never Smoker  . Smokeless tobacco: Never Used  Vaping Use  . Vaping Use: Never used  Substance and Sexual Activity  . Alcohol use: Never  . Drug use: Never  . Sexual activity: Not on file  Other Topics Concern  . Not on file  Social History Narrative  . Not on file   Social Determinants of Health   Financial Resource Strain:   . Difficulty of Paying Living  Expenses: Not on file  Food Insecurity:   . Worried About Programme researcher, broadcasting/film/video in the Last Year: Not on file  . Ran Out of Food in the Last Year: Not on file  Transportation Needs:   . Lack of Transportation (Medical): Not on file  . Lack of Transportation (Non-Medical): Not on file  Physical Activity:   . Days of Exercise per Week: Not on file  . Minutes of Exercise per Session: Not on file  Stress:   . Feeling of Stress : Not on file  Social Connections:   . Frequency of Communication with Friends and Family: Not on file  . Frequency of Social Gatherings with Friends and Family: Not on file   . Attends Religious Services: Not on file  . Active Member of Clubs or Organizations: Not on file  . Attends Banker Meetings: Not on file  . Marital Status: Not on file   Allergies  Allergen Reactions  . Contrast Media [Iodinated Diagnostic Agents]     Pt doesn't remember     Medications   Medications Prior to Admission  Medication Sig Dispense Refill Last Dose  . albuterol (VENTOLIN HFA) 108 (90 Base) MCG/ACT inhaler Inhale 2 puffs into the lungs every 4 (four) hours as needed for wheezing or shortness of breath.   unkn  . aspirin EC 81 MG tablet Take 81 mg by mouth daily. Swallow whole.   2020/05/07 at Unknown time  . bisoprolol (ZEBETA) 10 MG tablet Take 10 mg by mouth daily.   05/07/20 at 0800  . budesonide-formoterol (SYMBICORT) 160-4.5 MCG/ACT inhaler Inhale 2 puffs into the lungs 2 (two) times daily.   05-07-20 at Unknown time  . digoxin (LANOXIN) 0.125 MG tablet Take 0.125 mg by mouth daily.   May 07, 2020 at Unknown time  . FLUoxetine (PROZAC) 40 MG capsule Take 40 mg by mouth daily.   May 07, 2020 at Unknown time  . fluticasone (FLOVENT DISKUS) 50 MCG/BLIST diskus inhaler Inhale 2 puffs into the lungs 2 (two) times daily.   2020-05-07 at Unknown time  . furosemide (LASIX) 20 MG tablet Take 20 mg by mouth daily.   05-07-20 at Unknown time  . levothyroxine (SYNTHROID) 88 MCG tablet Take 88 mcg by mouth daily before breakfast.   May 07, 2020 at Unknown time  . losartan (COZAAR) 50 MG tablet Take 50 mg by mouth daily.   2020-05-07 at Unknown time  . magnesium hydroxide (MILK OF MAGNESIA) 400 MG/5ML suspension Take 30 mLs by mouth daily as needed for mild constipation.   May 07, 2020 at Unknown time  . omeprazole (PRILOSEC) 20 MG capsule Take 20 mg by mouth daily.   05-07-20 at Unknown time  . pravastatin (PRAVACHOL) 40 MG tablet Take 40 mg by mouth at bedtime.   05/07/20 at Unknown time  . simethicone (MYLICON) 125 MG chewable tablet Chew 125 mg by mouth 3 (three) times daily  as needed for flatulence.   07-May-2020 at Unknown time  . vitamin B-12 (CYANOCOBALAMIN) 100 MCG tablet Take 100 mcg by mouth daily.   05/07/20 at Unknown time     Vitals  Temp:  [97.5 F (36.4 C)-98.7 F (37.1 C)] 98.7 F (37.1 C) (09/28 2207) Pulse Rate:  [60-101] 101 (09/28 2207) Resp:  [15-24] 20 (09/28 1948) BP: (107-168)/(68-98) 154/74 (09/28 1948) SpO2:  [95 %-98 %] 96 % (09/28 2048) Weight:  [64 kg] 64 kg (09/28 0253)  Body mass index is 23.48 kg/m.  Physical Exam   General: Laying in the CT  scanner; difficult to calm her down. HENT: Normal oropharynx and mucosa. Normal external appearance of ears and nose. Neck: Supple, no pain or tenderness CV: No JVD. No peripheral edema. Pulmonary: Symmetric Chest rise. Normal respiratory effort. Abdomen: Soft to touch, non-tender Ext: No cyanosis, edema, or deformity  Skin: No rash. Normal palpation of skin.   Musculoskeletal: Normal digits and nails by inspection. No clubbing.  Neurologic Examination  Mental status/Cognition: Alert, oriented to self only. Speech/language: Dysarthric, fluent, difficulty comprehending complex commands, can only understand one step commands. Object naming intact, unable to repeat. Cranial nerves:   CN II Pupils equal and reactive to light, no VF deficits   CN III,IV,VI R gaze deviation.   CN V Does not regard touch on L face.   CN VII L facial droop.   CN VIII normal hearing to speech   CN IX & X    CN XI    CN XII    Motor:  Muscle bulk: poor , tone normal  Spontaneous movement noted in RUE and RLE, some movement in LLE. Minimal movement noted in LUE.  Does not recognize her L hand when held up in front of her.  Sensation: No response to nailbed pressure in LUE and LLE.  Labs   Lab Results  Component Value Date   NA 133 (L) 04/22/2020   K 4.2 04/22/2020   CL 102 04/22/2020   CO2 24 04/22/2020   GLUCOSE 95 04/22/2020   BUN 10 04/22/2020   CREATININE 0.97 04/22/2020   CALCIUM  8.6 (L) 04/22/2020   GFRNONAA 53 (L) 04/22/2020   GFRAA >60 04/22/2020     Imaging and Diagnostic studies   CTH without contrast: Severely motion degraded study without acute intracranial abnormality.  CT Angio Head and Neck: 1. 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 2. No other intracranial occlusion. 3. 50% stenosis of the right internal carotid artery at its origin. 4.  Aortic atherosclerosis (ICD10-I70.0).  MRI Brain without contrast: IMPRESSION: New area of acute/subacute cortical/subcortical ischemia predominantly within the left parietal lobe.  Impression   Victoria Oconnell is a 86 y.o. female with PMH significant for afibb not on AC due to GIB, mild dementia, CAD, CHF, HTN, L MCA stroke 2 days ago with aphasia who had sudden onset L sided weakness, R gaze deviation with neglect with a LKW of 2145 on 04/17/2020. CT Angio with multifocal occlusion of R MCA including R M1.  I discussed patient's presentation with her kids, Victoria Oconnell, Victoria BoSteven Oconnell and Victoria Oconnell. Discussed that her L sided weakness is likely due to the occlusion of the R MCA. It seems that Victoria Oconnell was living independently, driving and used either a cane or rollator or even a wheelchair depending on the distance that she needed to walk. Kids had to help out after she was lethargic due to GI bleed over the last couple of weeks. Her aphasia was also improving per discussion with kids and review of notes from the stroke team over the last 2 days. Given all of the above, we activated our Neuro IR team. Dr. Corliss Skainseveshwar discussed the risks and benefits of thrombectomy with Victoria Oconnell, Victoria Oconnell and Victoria Oconnell, who discussed this amongst themselves and agreed to proceed with thrombectomy. I was on the phone to witness the consent. All the questions were adequately answered by Dr. Corliss Skainseveshwar.  Recommendations  - Patient to be taken to thrombectomy. - Will be taken to ICE  afterwards. - Neuro  checks and NIHSS per protocol - Blood pressure goal per Dr. Corliss Skains after thrombectomy. ______________________________________________________________________  This patient is critically ill and at significant risk of neurological worsening, death and care requires constant monitoring of vital signs, hemodynamics,respiratory and cardiac monitoring, neurological assessment, discussion with family, other specialists and medical decision making of high complexity. I spent 105 minutes of neurocritical care time  in the care of  this patient. This was time spent independent of any time provided by nurse practitioner or PA.  Erick Blinks [facility] Pager Number [phone] 04/24/2020  1:07 AM   Thank you for the opportunity to take part in the care of this patient. If you have any further questions, please contact the neurology consultation attending.  Signed,  Erick Blinks [facility] Pager Number [phone]

## 2020-04-23 NOTE — Evaluation (Addendum)
Speech Language Pathology Evaluation Patient Details Name: Victoria Oconnell MRN: [id number] DOB: [birth year] Today's Date: 04/22/2020 Time: 8416-6063 SLP Time Calculation (min) (ACUTE ONLY): 30 min  Problem List:  Patient Active Problem List   Diagnosis Date Noted  . Cerebral embolism with cerebral infarction 04/22/2020  . Acute GI bleeding 04/19/2020  . Essential hypertension   . Diverticulosis   . CHF (congestive heart failure) (HCC)   . CAD (coronary artery disease)   . Atrial fibrillation (HCC)   . Asthma   . Dependence on nocturnal oxygen therapy   . Dementia (HCC)   . Anemia due to blood loss    Past Medical History:  Past Medical History:  Diagnosis Date  . Asthma   . Atrial fibrillation (HCC)   . CAD (coronary artery disease)    has stent  . CHF (congestive heart failure) (HCC)   . Dementia (HCC)    mild  . Dependence on nocturnal oxygen therapy    uncertain etiology  . Diverticulosis   . Essential hypertension   . GI bleeding 04/19/2020   Past Surgical History:  Past Surgical History:  Procedure Laterality Date  . ABDOMINAL HYSTERECTOMY    . BREAST LUMPECTOMY    . COLONOSCOPY WITH PROPOFOL N/A Apr 29, 2020   Procedure: COLONOSCOPY WITH PROPOFOL;  Surgeon: Jeani Hawking, MD;  Location: [facility] ENDOSCOPY;  Service: Endoscopy;  Laterality: N/A;  . ENTEROSCOPY N/A April 29, 2020   Procedure: ENTEROSCOPY;  Surgeon: Jeani Hawking, MD;  Location: [facility] ENDOSCOPY;  Service: Endoscopy;  Laterality: N/A;  . GIVENS CAPSULE STUDY N/A 04-29-2020   Procedure: GIVENS CAPSULE STUDY;  Surgeon: Jeani Hawking, MD;  Location: [facility] ENDOSCOPY;  Service: Endoscopy;  Laterality: N/A;   HPI:  86yo female admitted 04/17/2020 with GIB, weakness. PMH: AFib, multiple recent hospitalizations for GIB, diverticulosis, asthma, CAD, CHF, dementia, essential HTN. Abrupt MS change 04/21/20 @ 1100 - MRI = (sub)acute insults in left frontal cortex and left periventricular white matter. BSE 04/21/20 - reg/thin, no f/u    Assessment / Plan / Recommendation Clinical Impression  Pt presents with receptive and expressive aphasia.   Receptive Language: Responses to simple/concrete yes/no questions is accurate. She is unable to correctly answer complex/abstract yes/no questions. Pt is able to follow simple 1-step commands, but is unable to follow 2+ step commands. Right/Left discrimination is impaired. She is able to identify body parts and items around the room, but is perseverative.   Expressive Language: Pt has difficulty with automatic sequences, benefiting from semantic and phonemic cues. She is able to repeat words and parts of phrase length material, however, errors increase with length of the word/phrase. Pt has significant difficulty completing high probability phrases, naming items around the room and answering responsive naming tasks. Continued ST intervention is recommended to maximize effective communication. Anticipate continued ST needs after acute hospitalization.     SLP Assessment  SLP Recommendation/Assessment: Patient needs continued Speech Language Pathology Services  SLP Visit Diagnosis: Aphasia (R47.01)    Follow Up Recommendations  24 hour supervision/assistance    Frequency and Duration min 1 x/week  2 weeks      SLP Evaluation Cognition  Overall Cognitive Status: History of cognitive impairments - at baseline Arousal/Alertness: Awake/alert       Comprehension  Auditory Comprehension Overall Auditory Comprehension: Impaired Yes/No Questions: Impaired Basic Biographical Questions: 76-100% accurate Basic Immediate Environment Questions: 75-100% accurate Complex Questions: 0-24% accurate Commands: Impaired One Step Basic Commands: 75-100% accurate Two Step Basic Commands: 25-49% accurate Multistep Basic Commands: 0-24% accurate Conversation: Simple  Interfering Components: Pain EffectiveTechniques: Extra processing time;Repetition;Visual/Gestural cues Visual  Recognition/Discrimination Discrimination: Not tested Reading Comprehension Reading Status: Not tested    Expression Expression Primary Mode of Expression: Verbal Verbal Expression Overall Verbal Expression: Impaired Initiation: No impairment Automatic Speech: Counting;Day of week;Month of year;Social Response;Name Level of Generative/Spontaneous Verbalization: Sentence Repetition: Impaired Level of Impairment: Phrase level Naming: Impairment Responsive: 26-50% accurate Confrontation: Impaired Convergent: 25-49% accurate Divergent: 25-49% accurate Verbal Errors: Phonemic paraphasias;Aware of errors;Neologisms;Perseveration Pragmatics: No impairment Written Expression Dominant Hand: Right Written Expression: Not tested   Oral / Motor  Oral Motor/Sensory Function Overall Oral Motor/Sensory Function: Within functional limits Motor Speech Overall Motor Speech: Appears within functional limits for tasks assessed Respiration: Within functional limits Phonation: Normal Resonance: Within functional limits Articulation: Within functional limitis Intelligibility: Intelligible Motor Planning: Witnin functional limits   GO                   Emani Morad B. Murvin Natal, [facility], CCC-SLP Speech Language Pathologist Office: [phone]  Leigh Aurora 03/30/2020, 1:50 PM

## 2020-04-23 NOTE — Progress Notes (Addendum)
STROKE TEAM PROGRESS NOTE   INTERVAL HISTORY Patient is sitting up in bed.  Her granddaughter is at the bedside.  She remains aphasic but she is improved she is able to speak short sentences and few words.  She still has significant word finding difficulties and nonfluent speech.  She can follow simple one-step and occasional two-step commands.  Repeat MRI scan of the brain confirms left parietal embolic infarct.  Echocardiogram is yet pending.  Vital signs are stable. Vitals:   03/27/2020 0831 03/30/2020 0941 04/03/2020 1153 04/02/2020 1157  BP: (!) 157/98 (!) 150/68  (!) 168/77  Pulse: 72   60  Resp: 20   18  Temp: 97.6 F (36.4 C) (!) 97.5 F (36.4 C) 98.7 F (37.1 C) 98.7 F (37.1 C)  TempSrc: Oral Oral Oral Oral  SpO2: 96%   98%  Weight:      Height:       CBC:  Recent Labs  Lab 04/21/20 0530 04/22/20 0321  WBC 7.5 7.8  HGB 9.0* 9.6*  HCT 28.4* 30.8*  MCV 91.0 89.3  PLT 346 369   Basic Metabolic Panel:  Recent Labs  Lab 04/21/20 0530 04/22/20 0321  NA 133* 133*  K 3.8 4.2  CL 102 102  CO2 25 24  GLUCOSE 95 95  BUN 10 10  CREATININE 0.95 0.97  CALCIUM 8.4* 8.6*   Lipid Panel:  Recent Labs  Lab 04/21/20 0530  CHOL 105  TRIG 58  HDL 39*  CHOLHDL 2.7  VLDL 12  LDLCALC 54   HgbA1c:  Recent Labs  Lab 04/21/20 0530  HGBA1C 5.3   Urine Drug Screen: No results for input(s): LABOPIA, COCAINSCRNUR, LABBENZ, AMPHETMU, THCU, LABBARB in the last 168 hours.  Alcohol Level No results for input(s): ETH in the last 168 hours.  IMAGING past 24 hours DG Abd 1 View  Result Date: 04/22/2020 CLINICAL DATA:  Possible retained and ostomy capsule EXAM: ABDOMEN - 1 VIEW COMPARISON:  None. FINDINGS: Scattered large and small bowel gas is noted. No abnormal mass or abnormal calcifications are seen. No radiopaque foreign body is noted. Changes of scoliosis in the thoracolumbar spine and multiple pubic rami fractures are noted. IMPRESSION: No evidence of retained foreign body.  Electronically Signed   By: Alcide Clever M.D.   On: 04/22/2020 16:55   MR BRAIN WO CONTRAST  Result Date: 04/22/2020 CLINICAL DATA:  Stroke follow-up.  Memory loss. EXAM: MRI HEAD WITHOUT CONTRAST TECHNIQUE: Multiplanar, multiecho pulse sequences of the brain and surrounding structures were obtained without intravenous contrast. COMPARISON:  Brain MRI 04/21/2020 FINDINGS: An abbreviated protocol was performed including axial and coronal diffusion-weighted imaging and axial T2 FLAIR sequence. There is a new area of abnormal diffusion restriction primarily within the left parietal lobe and mostly cortical/subcortical. No hemorrhage or mass effect. Unchanged moderate periventricular white matter hyperintensity. IMPRESSION: New area of acute/subacute cortical/subcortical ischemia predominantly within the left parietal lobe. Electronically Signed   By: Deatra Robinson M.D.   On: 04/22/2020 20:32    PHYSICAL EXAM Frail elderly Caucasian lady not in distress. . Afebrile. Head is nontraumatic. Neck is supple without bruit.    Cardiac exam no murmur or gallop. Lungs are clear to auscultation. Distal pulses are well felt. Neurological Exam : She is awake alert she has moderate expressive language difficulties with comprehension also limited to simple midline and one-step commands.  Significant word finding difficulties and paraphasic errors.  She blinks to threat on both sides.  Eye movements are full  range.  Face is symmetric.  Tongue midline.  Motor system exam able to move all 4 extremities equally well against gravity without focal weakness.  Sensation appears preserved.  Gait not tested. ASSESSMENT/PLAN [name] is a 86 y.o. female with history of atrial fibrillation not on anticoagulation due to bleeding being considered for a watchman device for stroke prevention, mild dementia, coronary artery disease, congestive heart failure, hypertension, being treated in the hospital for a GI bleed who developed  sudden change in mentation and speech.   Stroke:  L frontal subtle DWI abnormality, unclear if stroke or artifact or seizure - regardless, aphasia out of proportion to size, and follow-up repeat MRI confirms left parietal embolic infarct Also has a subacute L subcortical infarct likely age-indeterminate and from small vessel disease.  She also has AF not on [facility]   Code Stroke CT head No acute abnormality. ASPECTS 10.     MRI / MRA  Acute/subacute L frontal cortex and L periventricular white matter infarcts. Old B basal ganglia hemorrhages. Old L corona radiata infarct. Small vessel disease. Atrophy. L M2 occlusion.  Repeat limited MRI to look at stroke  Carotid Doppler  B ICA 1-39% stenosis, VAs antegrade   2D Echo pending   EEG generalized slowing  LDL 54  HgbA1c 5.3  VTE prophylaxis - SCDs   aspirin 81 mg daily prior to admission, now on No antithrombotic given anemia requiring transfusion  Therapy recommendations:  HH PT, HH OT, HH SLP   Disposition:  pending   DNR  Atrial Fibrillation  Home anticoagulation:  none (previously on Xarelto, last dose 2 weeks ago)  Being considered for watchman device  Not an AC candidate given recent GI bleeding   Hypertension  Stable . BP goal normotensive  Hyperlipidemia  Home meds:  pravachol 40, resumed in hospital  LDL 54, goal < 70  Continue statin at discharge  Other Stroke Risk Factors  Advanced age  Coronary artery disease  Congestive heart failure  Other Active Problems  Dementia   GI bleeding, recent, without known source - multiple recent hospitalizations. Hgb as low as 4.9, transfused. no etiology found on capsule endoscopy. Previous colon and EGD unrevealing. Repeat colon, upper endo and capsule endo neg.   Asthma, on nocturnal O2  Hypothyroidism   Renal dysfunction  Hospital day # 4 Patient's aphasia and mental status appears way out of proportion to the tiny left frontal cortical hyperintensity  seen on DWI hence recommended repeat MRI which confirmed new left parietal embolic infarct likely from her underlying A. fib not on anticoagulation.  due to GI bleeding  she is not a good candidate for long-term anticoagulation.  No family available for discussion.  Continue aspirin.  Ongoing therapies.  Discussed with Dr. Benjamine Mola.  Greater than 50% time during this 25-minute visit was spent in counseling and coordination of care about her stroke versus seizure discussion and answering questions.  Stroke team will sign off.  Kindly call for questions. Delia Heady, MD To contact Stroke Continuity provider, please refer to [email or web address]. After hours, contact General Neurology

## 2020-04-23 NOTE — Anesthesia Preprocedure Evaluation (Addendum)
Anesthesia Evaluation  Patient identified by MRN, date of birth, ID band Patient confused    Reviewed: Unable to perform ROS - Chart review onlyPreop documentation limited or incomplete due to emergent nature of procedure.  History of Anesthesia Complications Negative for: history of anesthetic complications  Airway Mallampati: III  TM Distance: >3 FB Neck ROM: Full    Dental  (+) Poor Dentition, Chipped   Pulmonary asthma ,  Covid-19 Nucleic Acid Test Results Lab Results      Component                Value               Date                      SARSCOV2NAA              NEGATIVE            04/19/2020              breath sounds clear to auscultation       Cardiovascular hypertension, + CAD, + Cardiac Stents and +CHF  + dysrhythmias  Rhythm:Irregular     Neuro/Psych PSYCHIATRIC DISORDERS Dementia CVA, Residual Symptoms    GI/Hepatic negative GI ROS, Neg liver ROS,   Endo/Other  negative endocrine ROS  Renal/GU negative Renal ROS     Musculoskeletal   Abdominal   Peds  Hematology  (+) Blood dyscrasia, anemia , Lab Results      Component                Value               Date                      WBC                      7.8                 04/22/2020                HGB                      9.6 (L)             04/22/2020                HCT                      30.8 (L)            04/22/2020                MCV                      89.3                04/22/2020                PLT                      369                 04/22/2020              Anesthesia Other Findings Recent admit for GI bleed workup by GI, s/p 2 units blood. 2nd stroke during admission  prompting code stroke and IR procedure.   Reproductive/Obstetrics                          Anesthesia Physical Anesthesia Plan  ASA: III  Anesthesia Plan: General   Post-op Pain Management:    Induction: Intravenous, Rapid sequence and  Cricoid pressure planned  PONV Risk Score and Plan: 3 and Ondansetron, Dexamethasone and Treatment may vary due to age or medical condition  Airway Management Planned: Oral ETT  Additional Equipment: Arterial line  Intra-op Plan:   Post-operative Plan: Possible Post-op intubation/ventilation  Informed Consent:   Patient has DNR.  Discussed DNR with power of attorney.   History available from chart only, Only emergency history available and Consent reviewed with POA  Plan Discussed with: CRNA and Anesthesiologist  Anesthesia Plan Comments: (Discussed DNR with son, will remain full code during procedure with exception of chest compressions which will not be preformed. )      Anesthesia Quick Evaluation

## 2020-04-23 NOTE — TOC Initial Note (Signed)
Transition of Care [facility]) - Initial/Assessment Note    Patient Details  Name: Victoria Oconnell MRN: [id number] Date of Birth: 04-26-1930  [facility] CM/SW Contact:    Eduard Roux, LCSWA Phone Number: 04/07/2020, 4:49 PM  Clinical Narrative:                  CSW spoke with patient's son,Steven and her daughter in-law,Nicole. CSW introduced self and explained role. Patient's son, Viviann Spare requested his wife Joni Reining be the person to contact and provide updates,etc. Joni Reining states, patient lives in the home with her other son, Lyman Bishop but he unable to provide the level of care needed to assist the patient in the home. Patient also has two daughters,Jackie and Aurea Graff.She reports the family lives in [place] but family works in [place] and is able to better support the patient if SNF placement is in [place]. They expressed concerns about the patient possibly needing long-term care in the future. CSW explained the SNF process. CSW was given permission to send SNF referrals. Family states no preferred SNF.   CSW will continue to follow and assist with discharge planning. CSW will provide bed offers once available.  Antony Blackbird, MSW, LCSWA Clinical Social Worker    Expected Discharge Plan: Skilled Nursing Facility Barriers to Discharge: Continued Medical Work up   Patient Goals and CMS Choice        Expected Discharge Plan and Services Expected Discharge Plan: Skilled Nursing Facility In-house Referral: Clinical Social Work     Living arrangements for the past 2 months: Single Family Home                                      Prior Living Arrangements/Services Living arrangements for the past 2 months: Single Family Home Lives with:: Self, Adult Children Patient language and need for interpreter reviewed:: No        Need for Family Participation in Patient Care: Yes (Comment) Care giver support system in place?: Yes (comment)   Criminal  Activity/Legal Involvement Pertinent to Current Situation/Hospitalization: No - Comment as needed  Activities of Daily Living Home Assistive Devices/Equipment: Environmental consultant (specify type), Cane (specify quad or straight) ADL Screening (condition at time of admission) Patient's cognitive ability adequate to safely complete daily activities?: Yes Is the patient deaf or have difficulty hearing?: No Does the patient have difficulty seeing, even when wearing glasses/contacts?: No Does the patient have difficulty concentrating, remembering, or making decisions?: No Patient able to express need for assistance with ADLs?: Yes Does the patient have difficulty dressing or bathing?: No Independently performs ADLs?: Yes (appropriate for developmental age) Does the patient have difficulty walking or climbing stairs?: Yes Weakness of Legs: Both Weakness of Arms/Hands: None  Permission Sought/Granted Permission sought to share information with : Family Supports Permission granted to share information with : Yes, Verbal Permission Granted  Share Information with NAME: Catalina Lunger  Permission granted to share info w AGENCY: SNFs  Permission granted to share info w Relationship: daughter in law  Permission granted to share info w Contact Information: [phone]  Emotional Assessment       Orientation: : Oriented to Self Alcohol / Substance Use: Not Applicable Psych Involvement: No (comment)  Admission diagnosis:  Lower gastrointestinal bleeding [K92.2] Hyponatremia [E87.1] Acute lower GI bleeding [K92.2] Acute GI bleeding [K92.2] Anemia due to blood loss [D50.0] Thrombocytosis [D47.3] Permanent atrial fibrillation (HCC) [I48.21] Patient Active Problem  List   Diagnosis Date Noted  . Cerebral embolism with cerebral infarction 04/22/2020  . Acute GI bleeding 04/19/2020  . Essential hypertension   . Diverticulosis   . CHF (congestive heart failure) (HCC)   . CAD (coronary artery disease)    . Atrial fibrillation (HCC)   . Asthma   . Dependence on nocturnal oxygen therapy   . Dementia (HCC)   . Anemia due to blood loss    PCP:  Alben Deeds, MD Pharmacy:   [facility] [address] [place] - [address] [place] [address] Phone: [phone] Fax: [phone]     Social Determinants of Health (SDOH) Interventions    Readmission Risk Interventions No flowsheet data found.

## 2020-04-23 NOTE — Progress Notes (Signed)
[facility] Progress Note    Since last GI note: Another MRI of her brain last night. This shows a new area of  ischemic brain insult.  She is off blood thinners.  No overt GI bleeding anytime recently per RN.  Objective: Vital signs in last 24 hours: Temp:  [97.5 F (36.4 C)-98.8 F (37.1 C)] 97.5 F (36.4 C) (09/28 0941) Pulse Rate:  [60-85] 72 (09/28 0831) Resp:  [15-24] 20 (09/28 0831) BP: (134-164)/(68-98) 150/68 (09/28 0941) SpO2:  [95 %-98 %] 96 % (09/28 0831) Weight:  [64 kg] 64 kg (09/28 0253) Last BM Date: 04/20/2020 General: alert and oriented times 1 Heart: regular rate and rythm Abdomen: soft, non-tender, non-distended, normal bowel sounds  Lab Results: Recent Labs    03/31/2020 1641 04/21/20 0530 04/22/20 0321  WBC 8.4 7.5 7.8  HGB 9.7* 9.0* 9.6*  PLT 325 346 369  MCV 89.9 91.0 89.3   Recent Labs    04/21/20 0530 04/22/20 0321  NA 133* 133*  K 3.8 4.2  CL 102 102  CO2 25 24  GLUCOSE 95 95  BUN 10 10  CREATININE 0.95 0.97  CALCIUM 8.4* 8.6*   Medications: Scheduled Meds: . bisoprolol  5 mg Oral Daily  . digoxin  0.125 mg Oral Daily  . FLUoxetine  20 mg Oral Daily  . levothyroxine  88 mcg Oral QAC breakfast  . mometasone-formoterol  2 puff Inhalation BID  . pantoprazole (PROTONIX) IV  40 mg Intravenous Q24H  . pravastatin  40 mg Oral QHS   Continuous Infusions: . sodium chloride 20 mL/hr at 04/22/20 0010   PRN Meds:.acetaminophen **OR** acetaminophen, albuterol, ondansetron **OR** ondansetron (ZOFRAN) IV, sodium chloride flush  Assessment/Plan: 86 y.o. female with overt, obscure bleeding in setting of blood thinner  Extensive GI testing including 2 or 3 colonoscopies, EGD with enteroscopy, small bowel capsule endo have really failed to identify a clear source of GI blood loss.  She is off her blood thinner now and is having ischemic brain insults.    I think a palliative care consultation is a good idea here and in the meantime  OK to resume her blood thinner.  If she has recurrent overt GI bleeding please call or page me. Currently no plans for further invasive GI testing.  Rachael Fee, MD  04/05/2020, 9:56 AM [facility] Pager [phone]

## 2020-04-24 ENCOUNTER — Inpatient Hospital Stay (HOSPITAL_COMMUNITY): Payer: Medicare Other

## 2020-04-24 ENCOUNTER — Encounter (HOSPITAL_COMMUNITY): Payer: Self-pay | Admitting: Internal Medicine

## 2020-04-24 DIAGNOSIS — I63511 Cerebral infarction due to unspecified occlusion or stenosis of right middle cerebral artery: Secondary | ICD-10-CM

## 2020-04-24 DIAGNOSIS — I6601 Occlusion and stenosis of right middle cerebral artery: Secondary | ICD-10-CM | POA: Diagnosis present

## 2020-04-24 HISTORY — PX: IR PERCUTANEOUS ART THROMBECTOMY/INFUSION INTRACRANIAL INC DIAG ANGIO: IMG6087

## 2020-04-24 HISTORY — PX: IR CT HEAD LTD: IMG2386

## 2020-04-24 LAB — POCT I-STAT 7, (LYTES, BLD GAS, ICA,H+H)
Acid-Base Excess: 2 mmol/L (ref 0.0–2.0)
Bicarbonate: 25.1 mmol/L (ref 20.0–28.0)
Calcium, Ion: 1.15 mmol/L (ref 1.15–1.40)
HCT: 25 % — ABNORMAL LOW (ref 36.0–46.0)
Hemoglobin: 8.5 g/dL — ABNORMAL LOW (ref 12.0–15.0)
O2 Saturation: 100 %
Patient temperature: 98.6
Potassium: 4 mmol/L (ref 3.5–5.1)
Sodium: 137 mmol/L (ref 135–145)
TCO2: 26 mmol/L (ref 22–32)
pCO2 arterial: 34 mmHg (ref 32.0–48.0)
pH, Arterial: 7.475 — ABNORMAL HIGH (ref 7.350–7.450)
pO2, Arterial: 348 mmHg — ABNORMAL HIGH (ref 83.0–108.0)

## 2020-04-24 LAB — CBC WITH DIFFERENTIAL/PLATELET
Abs Immature Granulocytes: 0.04 10*3/uL (ref 0.00–0.07)
Basophils Absolute: 0 10*3/uL (ref 0.0–0.1)
Basophils Relative: 0 %
Eosinophils Absolute: 0 10*3/uL (ref 0.0–0.5)
Eosinophils Relative: 0 %
HCT: 29 % — ABNORMAL LOW (ref 36.0–46.0)
Hemoglobin: 8.9 g/dL — ABNORMAL LOW (ref 12.0–15.0)
Immature Granulocytes: 1 %
Lymphocytes Relative: 6 %
Lymphs Abs: 0.4 10*3/uL — ABNORMAL LOW (ref 0.7–4.0)
MCH: 27.7 pg (ref 26.0–34.0)
MCHC: 30.7 g/dL (ref 30.0–36.0)
MCV: 90.3 fL (ref 80.0–100.0)
Monocytes Absolute: 0.2 10*3/uL (ref 0.1–1.0)
Monocytes Relative: 2 %
Neutro Abs: 7.4 10*3/uL (ref 1.7–7.7)
Neutrophils Relative %: 91 %
Platelets: 319 10*3/uL (ref 150–400)
RBC: 3.21 MIL/uL — ABNORMAL LOW (ref 3.87–5.11)
RDW: 15.4 % (ref 11.5–15.5)
WBC: 8.1 10*3/uL (ref 4.0–10.5)
nRBC: 0 % (ref 0.0–0.2)

## 2020-04-24 LAB — MRSA PCR SCREENING: MRSA by PCR: NEGATIVE

## 2020-04-24 LAB — BASIC METABOLIC PANEL
Anion gap: 9 (ref 5–15)
BUN: 10 mg/dL (ref 8–23)
CO2: 21 mmol/L — ABNORMAL LOW (ref 22–32)
Calcium: 7.8 mg/dL — ABNORMAL LOW (ref 8.9–10.3)
Chloride: 106 mmol/L (ref 98–111)
Creatinine, Ser: 0.77 mg/dL (ref 0.44–1.00)
GFR calc Af Amer: >60 mL/min (ref >60)
GFR calc non Af Amer: >60 mL/min (ref >60)
Glucose, Bld: 158 mg/dL — ABNORMAL HIGH (ref 70–99)
Potassium: 3.8 mmol/L (ref 3.5–5.1)
Sodium: 136 mmol/L (ref 135–145)

## 2020-04-24 LAB — TRIGLYCERIDES: Triglycerides: 68 mg/dL (ref <150)

## 2020-04-24 IMAGING — XA IR PERCUTANEOUS ART THORMBECTOMY/INFUSION INTRACRANIAL INCLUDE D
1 series · 11 of 24 positions shown · non-contrast
Comparison: CT angiogram of the head and neck [DATE].

INDICATION: New onset left-sided weakness with neglect. CT of the brain
revealing right middle cerebral artery M1 segment occlusion.

EXAM:
1. EMERGENT LARGE VESSEL OCCLUSION THROMBOLYSIS (anterior
CIRCULATION)
TECHNIQUE: Following a full explanation of the procedure along with the
potential associated complications, an informed witnessed consent
was obtained. The risks of intracranial hemorrhage of 10%, worsening
neurological deficit, ventilator dependency, death and inability to
revascularize were all reviewed in detail with the patient's
daughter and son-in-law.

[Series 300: dr. (person_name). · 11 of 78 slices shown]
[im 4/78]
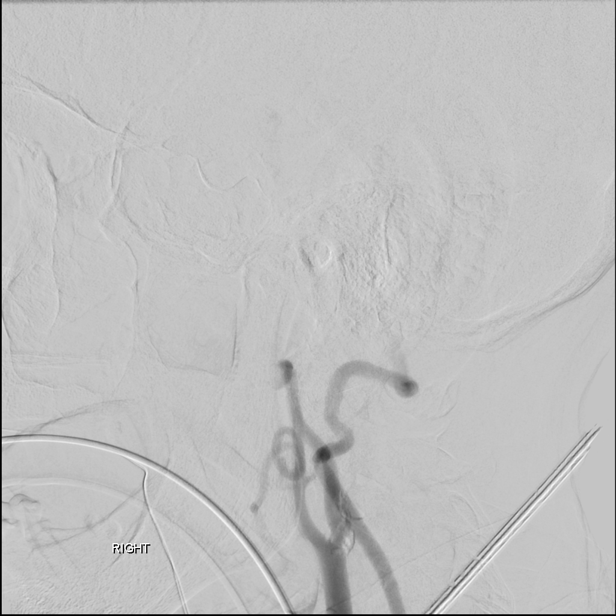
[im 11/78]
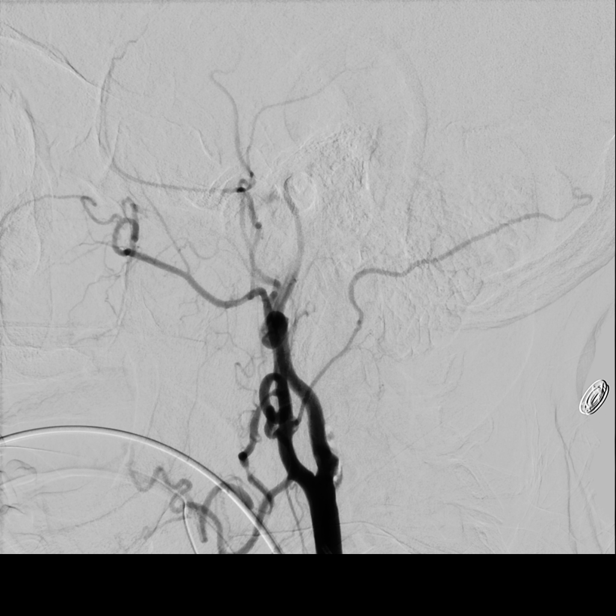
[im 17/78]
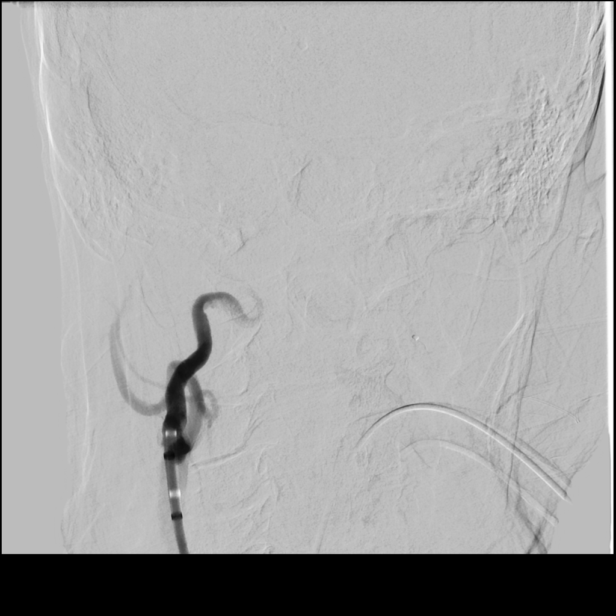
[im 24/78]
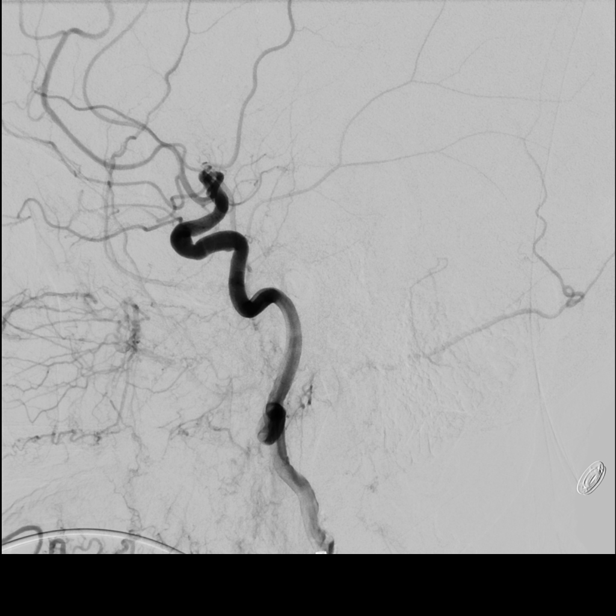
[im 31/78]
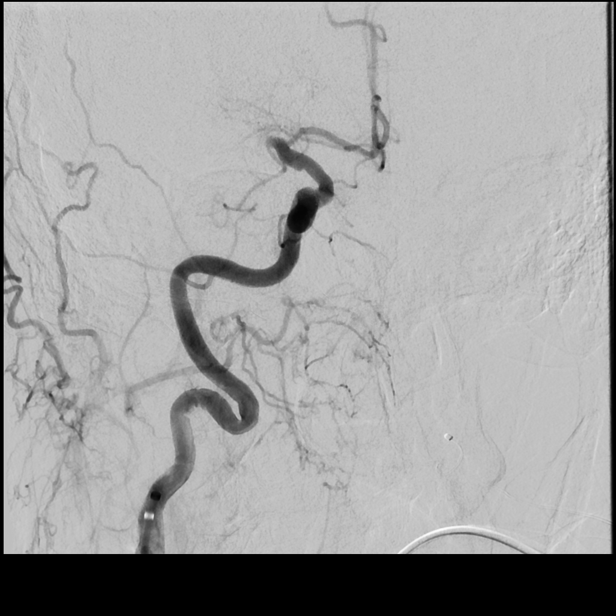
[im 41/78]
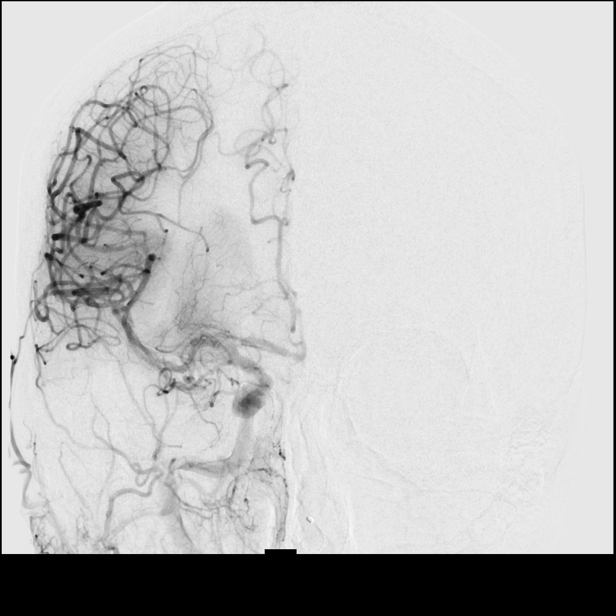
[im 47/78]
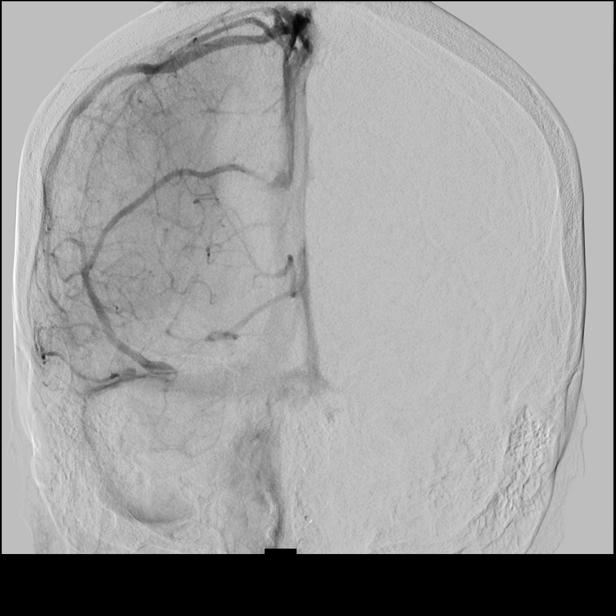
[im 54/78]
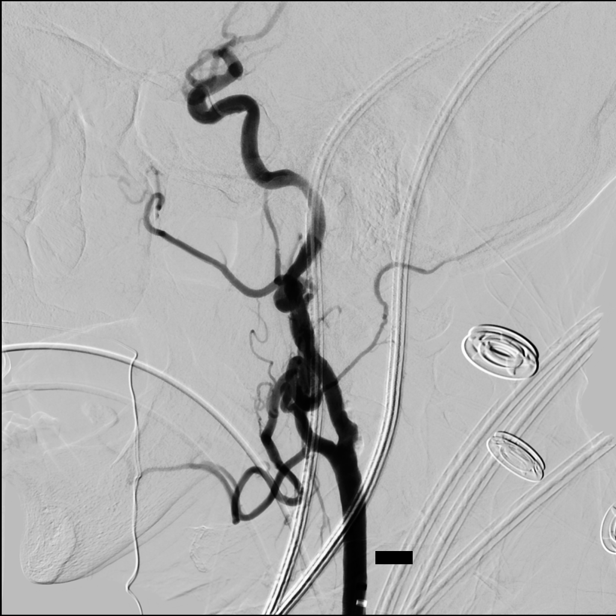
[im 61/78]
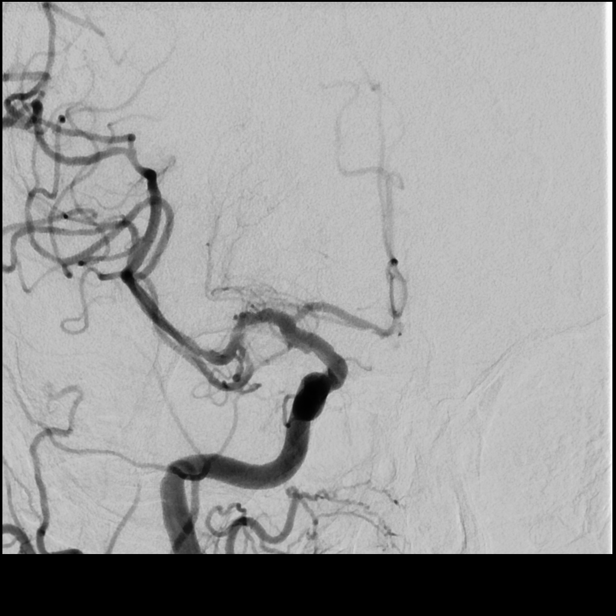
[im 67/78]
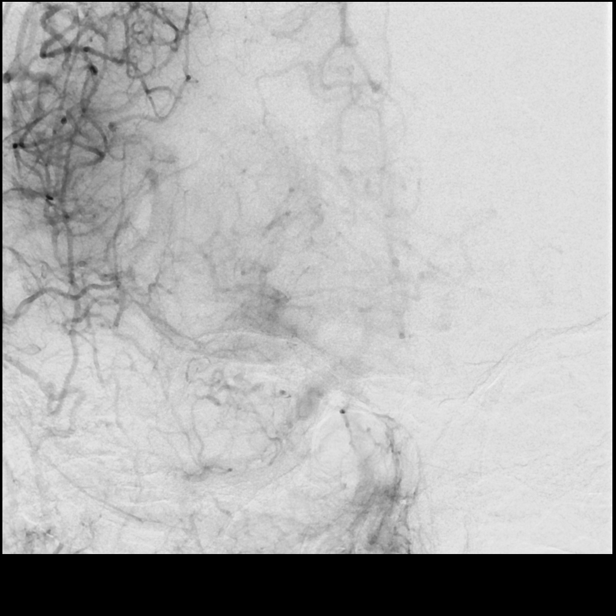
[im 74/78]
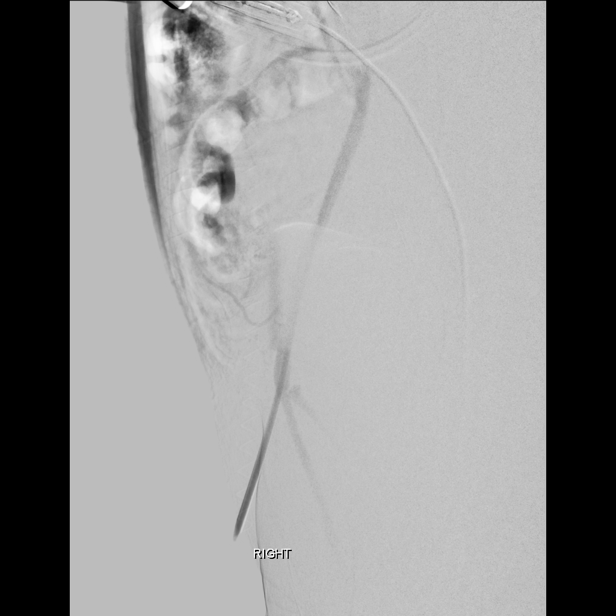

[11 of 24 positions shown; findings below may reference images not displayed]

MEDICATIONS:
Ancef 2 g IV antibiotic was administered within 1 hour of the
procedure.

ANESTHESIA/SEDATION:
General anesthesia

CONTRAST:  Isovue 300 approximately 85 mL

FLUOROSCOPY TIME:  Fluoroscopy Time: 38 minutes 42 seconds (983
mGy).

COMPLICATIONS:
None immediate.
The patient was then put under general anesthesia by the [REDACTED] at [HOSPITAL].

The right groin was prepped and draped in the usual sterile fashion.
Thereafter using modified Seldinger technique, transfemoral access
into the right common femoral artery was obtained without
difficulty. Over a 0.035 inch guidewire an 8 French 25 cm Pinnacle
sheath was inserted. Through this, and also over a 0.035 inch
guidewire a 5 French select TAVAUN catheter inside an 087 balloon
guide catheter combination was advanced to the aortic arch region
and selectively positioned in the common carotid artery just
proximal to the right common carotid bifurcation. The select
catheter, and the guidewire were removed. Good aspiration obtained
from the hub of the balloon guide catheter.
FINDINGS: The right common carotid arteriogram demonstrates the right external
carotid artery and its major branches to be widely patent.

The right internal carotid artery at the bulb to the cranial skull
base demonstrates wide patency, however, with significant moderate
to moderately severe tortuosity in the entirety of the approximately
[DATE] of the right internal carotid artery.

The petrous, the cavernous and the supraclinoid segments demonstrate
wide patency.

Complete angiographic occlusion of the right middle cerebral artery
at the origin was verified. The right anterior cerebral artery
opacifies into the capillary and the venous phases. Also
demonstrated were mild FMD-like changes involving the mid [DATE] of the
tortuous right internal carotid artery.

PROCEDURE:
Through the 087 balloon guide catheter in the proximal right
internal carotid artery, a 134 cm 6 French Catalyst intermediary
catheter inside with an 021 microcatheter combination was advanced
over 014 inch standard Synchro micro guidewire with a J
configuration to avoid dissections or inducing spasm to the
supraclinoid right ICA.

The micro guidewire was then gently introduced without difficulty
through the occluded right middle cerebral artery into the M2 M3
region inferior division followed by the microcatheter. The micro
guidewire was removed. Good aspiration obtained from the hub of the
microcatheter. A gentle control arteriogram performed through the
microcatheter demonstrated safe position of the tip of the
microcatheter. This was then connected to continuous heparinized
saline infusion. A 5 mm x 33 mm Embotrap retrieval device was then
advanced to the distal end of the microcatheter.

This was then deployed by retrieving the microcatheter into the
cavernous right ICA.

The proximal portion of the Embotrap retrieval device was just
inside the proximal aspect of the occluded right middle cerebral
artery.

The 6 French Catalyst guide catheter was advanced into the right
middle cerebral artery and advanced into proximal aspect of the
occluded right middle cerebral artery.

With proximal flow arrest in the right internal carotid artery,
constant aspiration was then applied through the Catalyst guide
catheter for 2-1/2 minutes while aspiration was also applied with a
60 mL syringe at the hub of the balloon guide catheter. Thereafter
the retrieval device was gradually retrieved as the Catalyst guide
catheter was advanced into the more distal right M1 segment.

The combination of the retrieval device, and then the Catalyst guide
catheter was gently retrieved and removed. Flow arrest was reversed
in the right internal carotid artery.

A control arteriogram performed through the balloon guide in the
proximal right internal carotid artery demonstrated complete
revascularization of the right middle cerebral artery with patency
of the right anterior cerebral artery while being maintained.

Moderate spasm was seen in the right middle cerebral artery inferior
division and the superior division which responded to 2 aliquots of
25 mcg of nitroglycerin intra-arterially through the balloon guide
catheter. A final control arteriogram performed through the balloon
guide in the right common carotid artery demonstrated wide patency
of the right internal carotid artery. Extra cranially and
intracranially, there continued be a TICI 3 revascularization of the
right middle cerebral artery distribution.

Throughout the procedure the patient remained stable from a
neurological, and hemodynamic standpoint. No evidence of
extravasation or mass-effect was noted.

The balloon guide was removed. The 8 French Pinnacle sheath was
removed with successful hemostasis achieved with a 7 French ExoSeal
closure device. Distal pulses remained Dopplerable bilaterally
unchanged.

A CT of the brain performed demonstrated no evidence of mass effect
or midline shift. No evidence of intracranial hemorrhage was noted.

The patient was left intubated in view of her medical condition and
to protect her airway.

She was transferred to the neuro ICU for post revascularization
management.
IMPRESSION: Status post endovascular complete revascularization of occluded
right middle cerebral M1 segment with 1 pass with 5 mm x 33 mm
Embotrap retrieval device and aspiration achieving a TICI 3
revascularization.

PLAN:
Follow-up as per referring TAVAUN.

## 2020-04-24 IMAGING — MR MR HEAD W/O CM
5 of 8 series · 29 of 48 positions shown · non-contrast
Comparison: MRI of the brain [DATE].

CLINICAL DATA: Stroke follow-up.

EXAM:
MRI HEAD WITHOUT CONTRAST
TECHNIQUE: Multiplanar, multiecho pulse sequences of the brain and surrounding
structures were obtained without intravenous contrast.

[Series 3: DWI · axial · 3.0mm · 0.94mm/px · z∈[-85,+61]mm · 11 of 100 slices shown (1 of 2)]
[im 1/100]
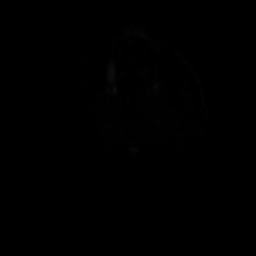
[im 10/100]
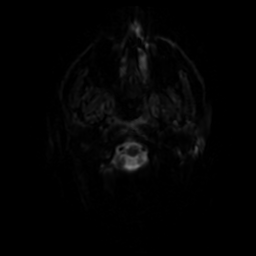
[im 20/100]
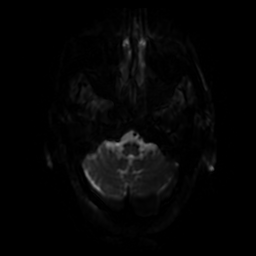
[im 30/100]
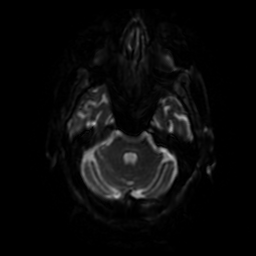
[im 40/100]
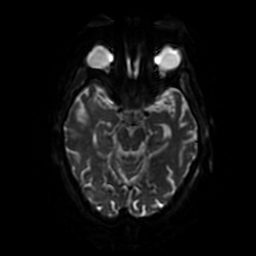
[im 50/100]
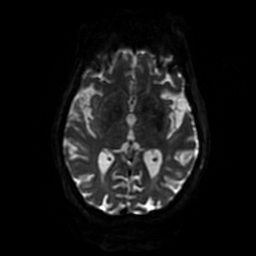
[im 60/100]
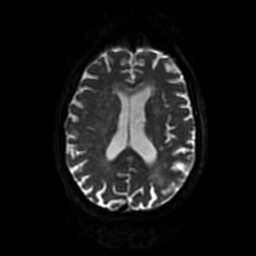
[im 70/100]
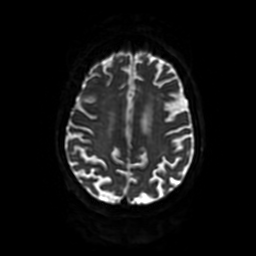
[im 80/100]
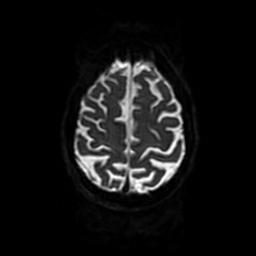
[im 90/100]
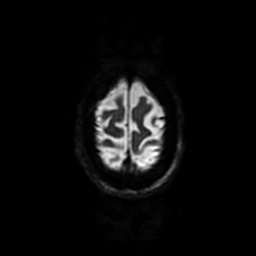
[im 100/100]
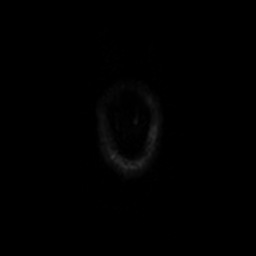

[Series 4: DWI · coronal · 4.0mm · 0.94mm/px · 7 of 70 slices shown (2 of 2)]
[im 1/70]
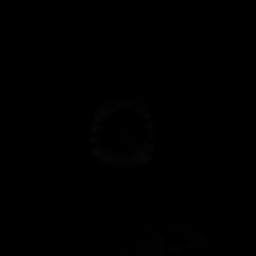
[im 12/70]
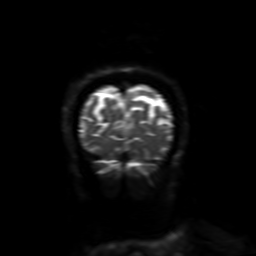
[im 24/70]
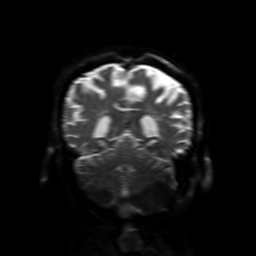
[im 35/70]
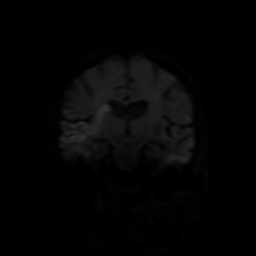
[im 47/70]
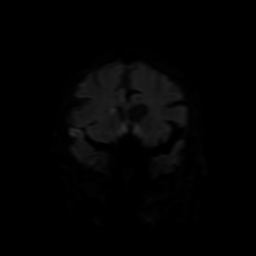
[im 58/70]
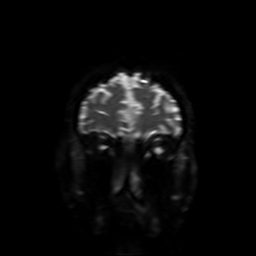
[im 70/70]
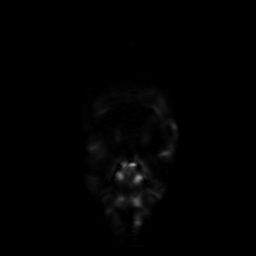

[Series 5: FLAIR · sagittal · 5.0mm · 0.23mm/px · 2 of 23 slices shown]
[im 1/23]
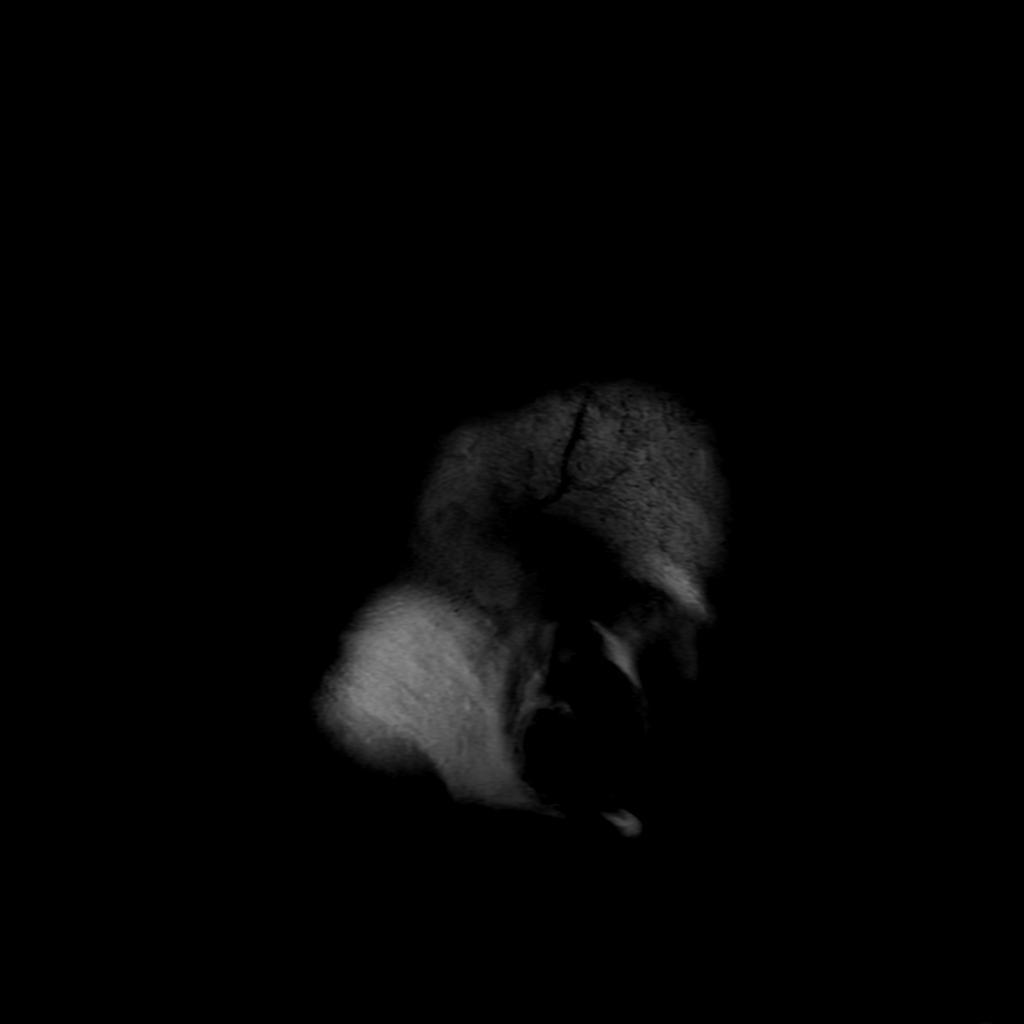
[im 23/23]
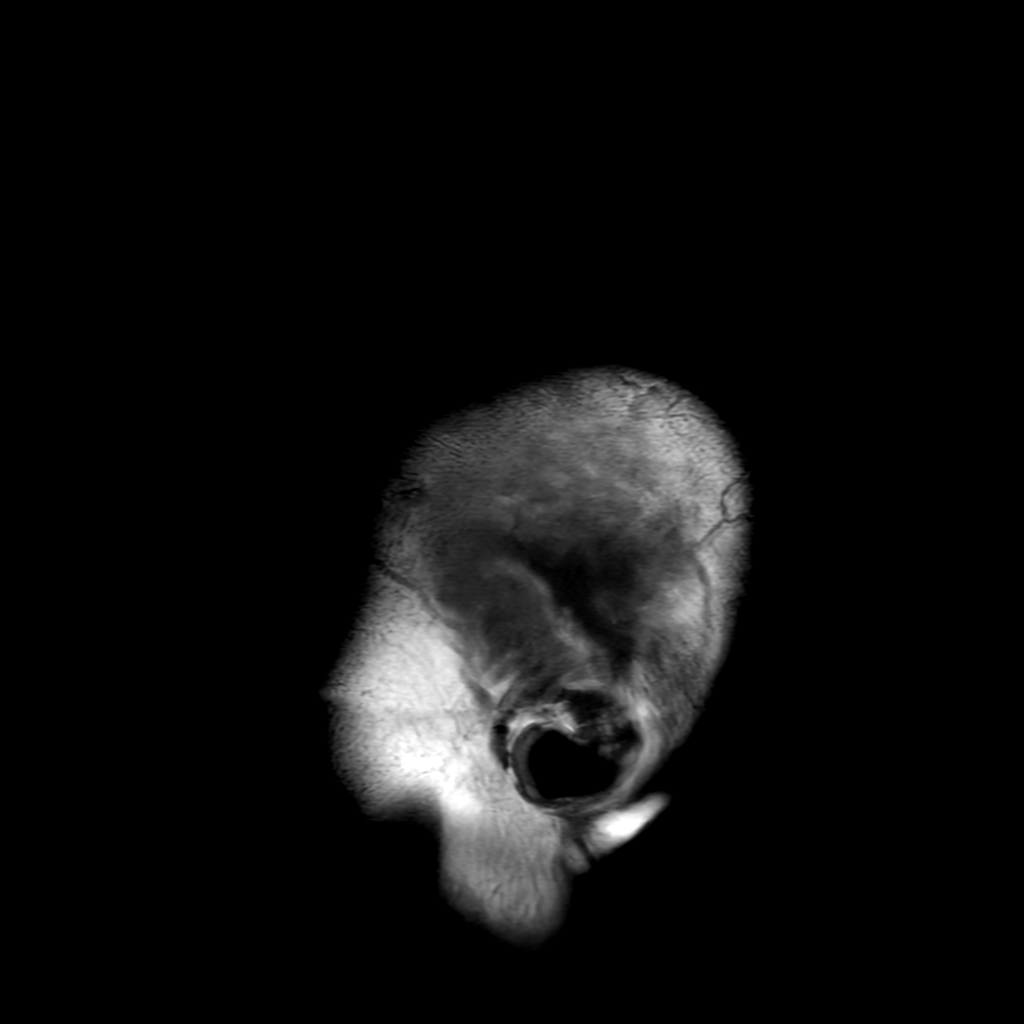

[Series 350: ADC · axial · 3.0mm · 0.94mm/px · z∈[-85,+61]mm · 5 of 50 slices shown (1 of 2)]
[im 1/50]
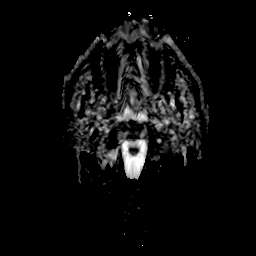
[im 13/50]
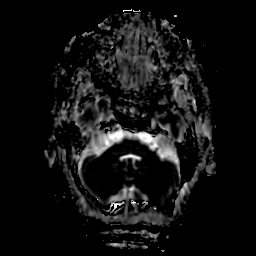
[im 25/50]
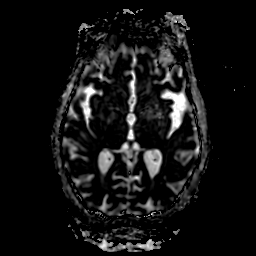
[im 37/50]
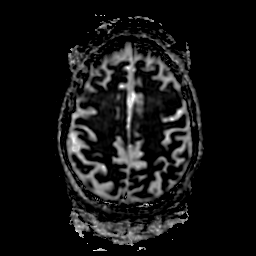
[im 50/50]
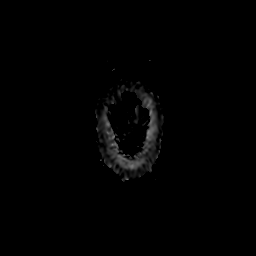

[Series 450: ADC · coronal · 4.0mm · 0.94mm/px · 4 of 35 slices shown (2 of 2)]
[im 1/35]
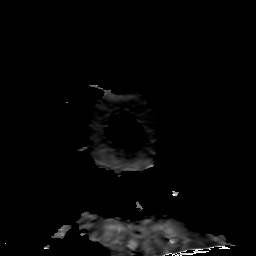
[im 12/35]
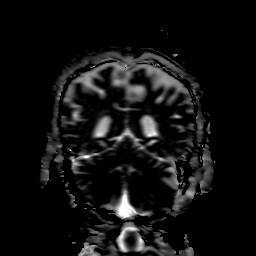
[im 23/35]
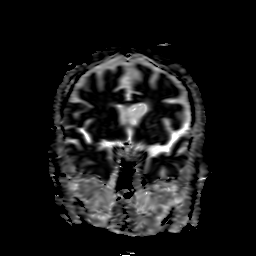
[im 35/35]
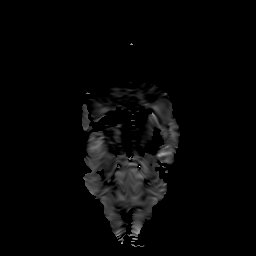

[29 of 48 positions shown; findings below may reference images not displayed]

FINDINGS: Brain: New area of restricted diffusion involving the anterior right
temporal lobe, insula and basal ganglia with faint corresponding
hyperintensity on FLAIR sequence suggesting hyperacute infarct. No
evidence of hemorrhagic transformation or significant mass effect.
Foci of susceptibility artifact in the right basal ganglia was
present on prior study.

Again seen is scattered foci of restricted diffusion in the left
parieto-occipital and temporal region, consistent with
acute/subacute infarct.

Scattered foci of T2 hyperintensity are seen within the white the
cerebral hemispheres, nonspecific, likely related to small vessel
ischemia.

Hydrocephalus, intracranial hemorrhage, mass or midline shift.

Vascular: Normal flow voids.

Skull and upper cervical spine: Normal marrow signal.

Sinuses/Orbits: Bilateral lens surgery. Small mucous retention cyst
in the left maxillary sinus. Mucosal thickening of the ethmoid
cells.

Other: None.
IMPRESSION: 1. New area of restricted diffusion involving the anterior right
temporal lobe, insula and basal ganglia with faint corresponding
hyperintensity on FLAIR sequence suggesting hyperacute infarct. No
evidence of hemorrhagic transformation or significant mass effect.
2. Stable scattered foci of restricted diffusion in the left
parieto-occipital and temporal region, consistent with
acute/subacute infarct.
3. Mild chronic small vessel ischemia.

## 2020-04-24 IMAGING — DX DG ABDOMEN 1V
1 series · 2 of 2 positions shown · non-contrast
Comparison: [DATE]

CLINICAL DATA: Orogastric tube placement

EXAM:
ABDOMEN - 1 VIEW

[Series 1: abdomen · 0.14mm/px · 2 of 2 slices shown]
[im 1/2]
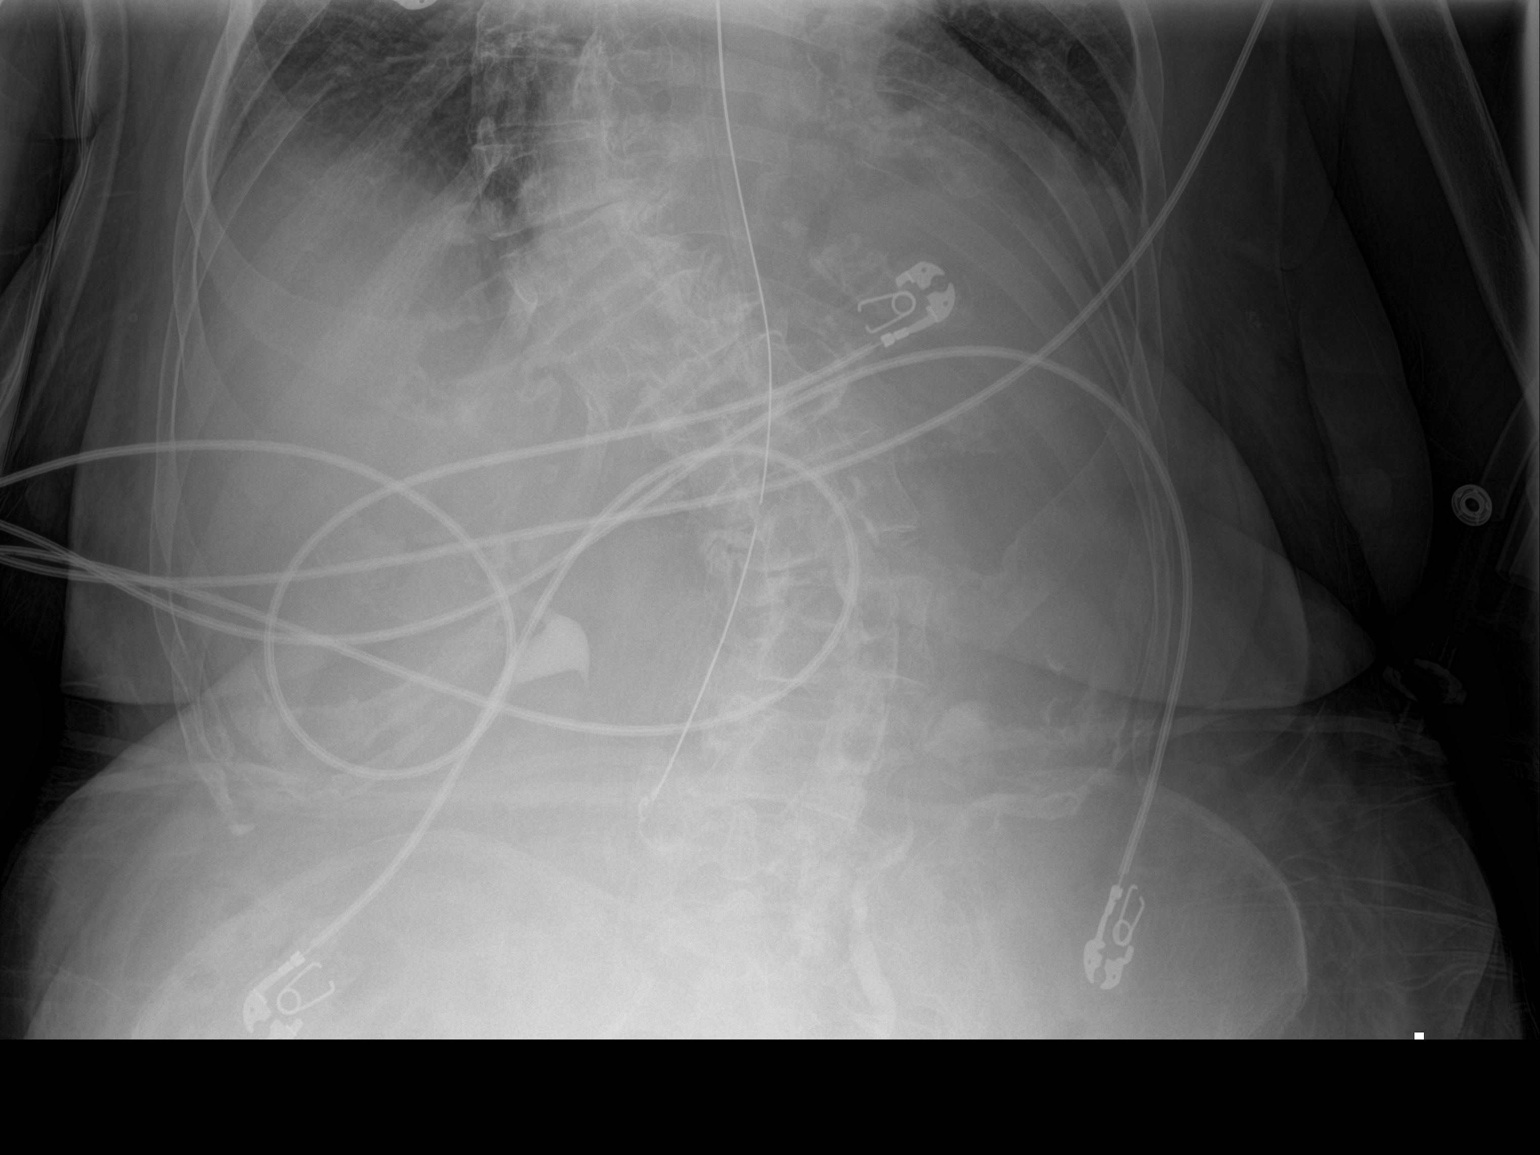
[im 2/2]
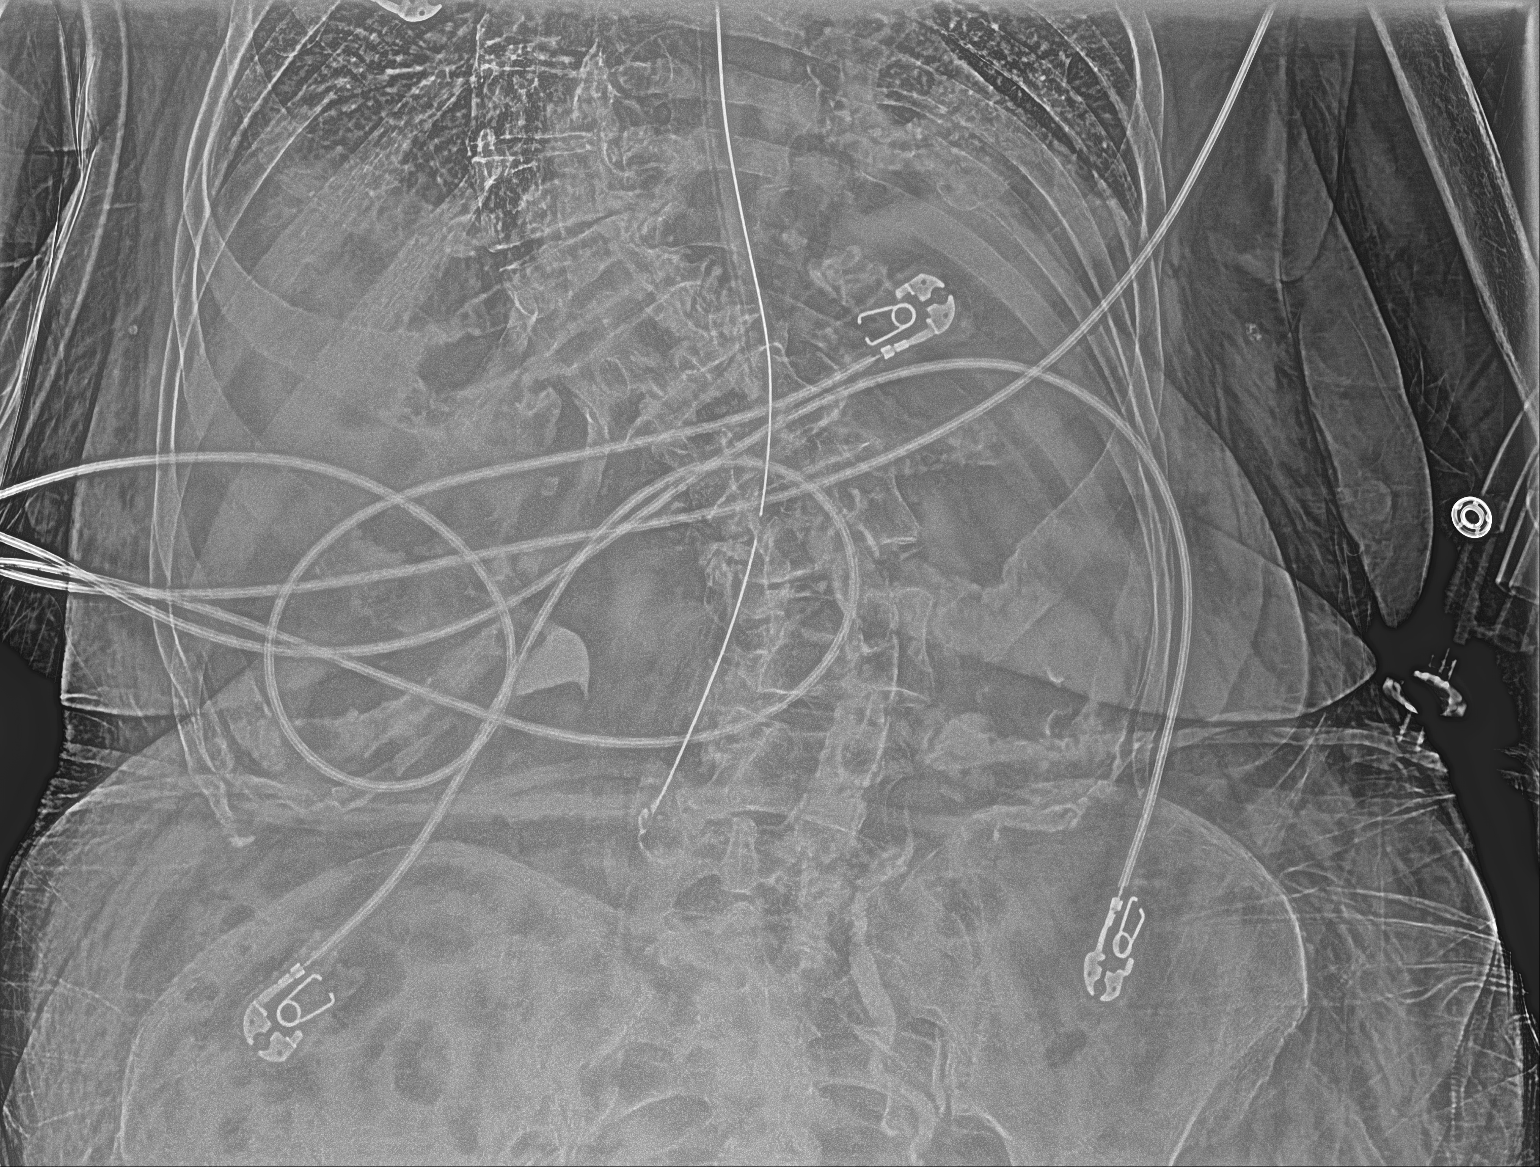

[2 of 2 positions shown; findings below may reference images not displayed]

FINDINGS: Orogastric tube tip and side port in stomach. There is no bowel
dilatation or air-fluid level to suggest bowel obstruction. No free
air. Contrast is noted in each renal collecting system and ureter.
There is lumbar levoscoliosis with degenerative change. There is
atelectatic change in the left lung base.
IMPRESSION: Orogastric tube tip and side port in stomach. No evident bowel
obstruction or free air.

## 2020-04-24 MED ORDER — POLYETHYLENE GLYCOL 3350 17 G PO PACK: 17.0000 g | PACK | Freq: Every day | ORAL | Status: DC

## 2020-04-24 MED ORDER — MORPHINE BOLUS VIA INFUSION
5.0000 mg | INTRAVENOUS | Status: DC | PRN
  Filled 2020-04-24: qty 5

## 2020-04-24 MED ORDER — DEXTROSE 5 % IV SOLN: INTRAVENOUS | Status: DC

## 2020-04-24 MED ORDER — MIDAZOLAM 50MG/50ML (1MG/ML) PREMIX INFUSION: 0.0000 mg/h | INTRAVENOUS | Status: DC

## 2020-04-24 MED ORDER — GLYCOPYRROLATE 0.2 MG/ML IJ SOLN: 0.2000 mg | INTRAMUSCULAR | Status: DC | PRN

## 2020-04-24 MED ORDER — SODIUM CHLORIDE 0.9 % IV SOLN: 250.0000 mL | INTRAVENOUS | Status: DC

## 2020-04-24 MED ORDER — IOHEXOL 300 MG/ML  SOLN
150.0000 mL | Freq: Once | INTRAMUSCULAR | Status: AC | PRN
  Administered 2020-04-24: 45 mL via INTRA_ARTERIAL

## 2020-04-24 MED ORDER — SODIUM CHLORIDE (PF) 0.9 % IJ SOLN
INTRAVENOUS | Status: AC | PRN
  Administered 2020-04-24: 25 ug via INTRA_ARTERIAL

## 2020-04-24 MED ORDER — CHLORHEXIDINE GLUCONATE CLOTH 2 % EX PADS
6.0000 | MEDICATED_PAD | Freq: Every day | CUTANEOUS | Status: DC
  Administered 2020-04-24 – 2020-04-25 (×2): 6 via TOPICAL

## 2020-04-24 MED ORDER — DOCUSATE SODIUM 50 MG/5ML PO LIQD: 100.0000 mg | Freq: Two times a day (BID) | ORAL | Status: DC

## 2020-04-24 MED ORDER — POLYVINYL ALCOHOL 1.4 % OP SOLN
1.0000 [drp] | Freq: Four times a day (QID) | OPHTHALMIC | Status: DC | PRN
  Filled 2020-04-24: qty 15

## 2020-04-24 MED ORDER — PROPOFOL 10 MG/ML IV BOLUS
INTRAVENOUS | Status: DC | PRN
  Administered 2020-04-24: 120 mg via INTRAVENOUS

## 2020-04-24 MED ORDER — PHENYLEPHRINE HCL-NACL 10-0.9 MG/250ML-% IV SOLN
INTRAVENOUS | Status: DC | PRN
  Administered 2020-04-24: 40 ug/min via INTRAVENOUS

## 2020-04-24 MED ORDER — CHLORHEXIDINE GLUCONATE 0.12% ORAL RINSE (MEDLINE KIT)
15.0000 mL | Freq: Two times a day (BID) | OROMUCOSAL | Status: DC
  Administered 2020-04-24 – 2020-04-25 (×4): 15 mL via OROMUCOSAL

## 2020-04-24 MED ORDER — ROCURONIUM 10MG/ML (10ML) SYRINGE FOR MEDFUSION PUMP - OPTIME
INTRAVENOUS | Status: DC | PRN
  Administered 2020-04-24: 100 mg via INTRAVENOUS

## 2020-04-24 MED ORDER — GLYCOPYRROLATE 1 MG PO TABS
1.0000 mg | ORAL_TABLET | ORAL | Status: DC | PRN
  Filled 2020-04-24: qty 1

## 2020-04-24 MED ORDER — ACETAMINOPHEN 325 MG PO TABS: 650.0000 mg | ORAL_TABLET | ORAL | Status: DC | PRN

## 2020-04-24 MED ORDER — FENTANYL CITRATE (PF) 100 MCG/2ML IJ SOLN
INTRAMUSCULAR | Status: DC | PRN
  Administered 2020-04-24: 100 ug via INTRAVENOUS

## 2020-04-24 MED ORDER — SODIUM CHLORIDE 0.9 % IV SOLN: INTRAVENOUS | Status: DC | PRN

## 2020-04-24 MED ORDER — FENTANYL CITRATE (PF) 100 MCG/2ML IJ SOLN
INTRAMUSCULAR | Status: AC
  Filled 2020-04-24: qty 2

## 2020-04-24 MED ORDER — FENTANYL 2500MCG IN NS 250ML (10MCG/ML) PREMIX INFUSION
25.0000 ug/h | INTRAVENOUS | Status: DC
  Administered 2020-04-24: 100 ug/h via INTRAVENOUS
  Filled 2020-04-24: qty 250

## 2020-04-24 MED ORDER — LEVOTHYROXINE SODIUM 88 MCG PO TABS: 88.0000 ug | ORAL_TABLET | Freq: Every day | ORAL | Status: DC

## 2020-04-24 MED ORDER — ACETAMINOPHEN 325 MG PO TABS: 650.0000 mg | ORAL_TABLET | Freq: Four times a day (QID) | ORAL | Status: DC | PRN

## 2020-04-24 MED ORDER — FENTANYL CITRATE (PF) 100 MCG/2ML IJ SOLN: 25.0000 ug | Freq: Once | INTRAMUSCULAR | Status: DC

## 2020-04-24 MED ORDER — FLUOXETINE HCL 20 MG PO CAPS: 20.0000 mg | ORAL_CAPSULE | Freq: Every day | ORAL | Status: DC

## 2020-04-24 MED ORDER — FENTANYL CITRATE (PF) 100 MCG/2ML IJ SOLN: 25.0000 ug | INTRAMUSCULAR | Status: DC | PRN

## 2020-04-24 MED ORDER — DIPHENHYDRAMINE HCL 50 MG/ML IJ SOLN: 25.0000 mg | INTRAMUSCULAR | Status: DC | PRN

## 2020-04-24 MED ORDER — PRAVASTATIN SODIUM 40 MG PO TABS
40.0000 mg | ORAL_TABLET | Freq: Every day | ORAL | Status: DC
  Filled 2020-04-24: qty 1

## 2020-04-24 MED ORDER — MORPHINE SULFATE (PF) 2 MG/ML IV SOLN: 2.0000 mg | INTRAVENOUS | Status: DC | PRN

## 2020-04-24 MED ORDER — ACETAMINOPHEN 650 MG RE SUPP: 650.0000 mg | Freq: Four times a day (QID) | RECTAL | Status: DC | PRN

## 2020-04-24 MED ORDER — PHENYLEPHRINE HCL (PRESSORS) 10 MG/ML IV SOLN
INTRAVENOUS | Status: DC | PRN
  Administered 2020-04-24: 80 ug via INTRAVENOUS

## 2020-04-24 MED ORDER — DIGOXIN 125 MCG PO TABS: 0.1250 mg | ORAL_TABLET | Freq: Every day | ORAL | Status: DC

## 2020-04-24 MED ORDER — PROPOFOL 1000 MG/100ML IV EMUL
0.0000 ug/kg/min | INTRAVENOUS | Status: DC
  Administered 2020-04-24: 40 ug/kg/min via INTRAVENOUS
  Filled 2020-04-24: qty 100
  Filled 2020-04-24: qty 200

## 2020-04-24 MED ORDER — IOHEXOL 300 MG/ML  SOLN
150.0000 mL | Freq: Once | INTRAMUSCULAR | Status: AC | PRN
  Administered 2020-04-24: 40 mL via INTRA_ARTERIAL

## 2020-04-24 MED ORDER — PHENYLEPHRINE HCL-NACL 10-0.9 MG/250ML-% IV SOLN
25.0000 ug/min | INTRAVENOUS | Status: DC
  Administered 2020-04-24: 20 ug/min via INTRAVENOUS

## 2020-04-24 MED ORDER — SUCCINYLCHOLINE 20MG/ML (10ML) SYRINGE FOR MEDFUSION PUMP - OPTIME
INTRAMUSCULAR | Status: DC | PRN
  Administered 2020-04-24: 100 mg via INTRAVENOUS

## 2020-04-24 MED ORDER — FENTANYL BOLUS VIA INFUSION
25.0000 ug | INTRAVENOUS | Status: DC | PRN
  Filled 2020-04-24: qty 25

## 2020-04-24 MED ORDER — ACETAMINOPHEN 650 MG RE SUPP: 650.0000 mg | RECTAL | Status: DC | PRN

## 2020-04-24 MED ORDER — CEFAZOLIN SODIUM-DEXTROSE 2-3 GM-%(50ML) IV SOLR
INTRAVENOUS | Status: DC | PRN
  Administered 2020-04-24: 2 g via INTRAVENOUS

## 2020-04-24 MED ORDER — NITROGLYCERIN 1 MG/10 ML FOR IR/CATH LAB
INTRA_ARTERIAL | Status: AC
  Filled 2020-04-24: qty 10

## 2020-04-24 MED ORDER — SODIUM CHLORIDE 0.9 % IV BOLUS
1000.0000 mL | Freq: Once | INTRAVENOUS | Status: AC
  Administered 2020-04-24: 1000 mL via INTRAVENOUS

## 2020-04-24 MED ORDER — MIDAZOLAM HCL 2 MG/2ML IJ SOLN
1.0000 mg | INTRAMUSCULAR | Status: DC | PRN
  Administered 2020-04-24: 1 mg via INTRAVENOUS
  Filled 2020-04-24: qty 2

## 2020-04-24 MED ORDER — CLEVIDIPINE BUTYRATE 0.5 MG/ML IV EMUL
0.0000 mg/h | INTRAVENOUS | Status: DC
  Administered 2020-04-24: 4 mg/h via INTRAVENOUS
  Administered 2020-04-24: 2 mg/h via INTRAVENOUS
  Filled 2020-04-24: qty 100

## 2020-04-24 MED ORDER — ORAL CARE MOUTH RINSE
15.0000 mL | OROMUCOSAL | Status: DC
  Administered 2020-04-24 – 2020-04-25 (×15): 15 mL via OROMUCOSAL

## 2020-04-24 MED ORDER — SODIUM CHLORIDE 0.9 % IV SOLN: INTRAVENOUS | Status: DC

## 2020-04-24 MED ORDER — MIDAZOLAM BOLUS VIA INFUSION (WITHDRAWAL LIFE SUSTAINING TX)
2.0000 mg | INTRAVENOUS | Status: DC | PRN
  Filled 2020-04-24: qty 2

## 2020-04-24 MED ORDER — DOCUSATE SODIUM 50 MG/5ML PO LIQD
100.0000 mg | Freq: Two times a day (BID) | ORAL | Status: DC
  Filled 2020-04-24: qty 10

## 2020-04-24 MED ORDER — MORPHINE 100MG IN NS 100ML (1MG/ML) PREMIX INFUSION
0.0000 mg/h | INTRAVENOUS | Status: DC
  Administered 2020-04-24 – 2020-04-25 (×3): 5 mg/h via INTRAVENOUS
  Filled 2020-04-24 (×3): qty 100

## 2020-04-24 MED ORDER — ACETAMINOPHEN 160 MG/5ML PO SOLN: 650.0000 mg | ORAL | Status: DC | PRN

## 2020-04-24 NOTE — Sedation Documentation (Signed)
Spoke with Sherita in pt placement. Pt will be transferred to 4N29. Room in the process of being cleaned.

## 2020-04-24 NOTE — Sedation Documentation (Signed)
SBAR given to [employer], Charity fundraiser at bedside. All questions answered to satisfaction.

## 2020-04-24 NOTE — Progress Notes (Signed)
Referring Physician(s): Erick BlinksKhaliqdina, Salman  Supervising Physician: Julieanne Cottoneveshwar, Sanjeev  [facility]  [facility]  Chief Complaint: Follow up code stroke s/p occluded right MCA M1 thrombectomy overnight with Dr. Corliss Skainseveshwar.  Subjective:  Patient remains intubated and sedated, per RN sedation is being weaned and patient moves to painful stimuli but not spontaneously. She appears to move her left side more than her right side. Overnight her BP has been labile and team was alternating between neo and cleveprex. No visitors present at bedside during exam.  Allergies: Contrast media [iodinated diagnostic agents]  Medications: Prior to Admission medications   Medication Sig Start Date End Date Taking? Authorizing Provider  albuterol (VENTOLIN HFA) 108 (90 Base) MCG/ACT inhaler Inhale 2 puffs into the lungs every 4 (four) hours as needed for wheezing or shortness of breath.   Yes [provider]  aspirin EC 81 MG tablet Take 81 mg by mouth daily. Swallow whole.   Yes [provider]  bisoprolol (ZEBETA) 10 MG tablet Take 10 mg by mouth daily.   Yes [provider]  budesonide-formoterol (SYMBICORT) 160-4.5 MCG/ACT inhaler Inhale 2 puffs into the lungs 2 (two) times daily.   Yes [provider]  digoxin (LANOXIN) 0.125 MG tablet Take 0.125 mg by mouth daily.   Yes [provider]  FLUoxetine (PROZAC) 40 MG capsule Take 40 mg by mouth daily.   Yes [provider]  fluticasone (FLOVENT DISKUS) 50 MCG/BLIST diskus inhaler Inhale 2 puffs into the lungs 2 (two) times daily.   Yes [provider]  furosemide (LASIX) 20 MG tablet Take 20 mg by mouth daily.   Yes [provider]  levothyroxine (SYNTHROID) 88 MCG tablet Take 88 mcg by mouth daily before breakfast.   Yes [provider]  losartan (COZAAR) 50 MG tablet Take 50 mg by mouth daily.   Yes [provider]  magnesium hydroxide (MILK OF MAGNESIA) 400  MG/5ML suspension Take 30 mLs by mouth daily as needed for mild constipation.   Yes [provider]  omeprazole (PRILOSEC) 20 MG capsule Take 20 mg by mouth daily.   Yes [provider]  pravastatin (PRAVACHOL) 40 MG tablet Take 40 mg by mouth at bedtime.   Yes [provider]  simethicone (MYLICON) 125 MG chewable tablet Chew 125 mg by mouth 3 (three) times daily as needed for flatulence.   Yes [provider]  vitamin B-12 (CYANOCOBALAMIN) 100 MCG tablet Take 100 mcg by mouth daily.   Yes [provider]     Vital Signs: BP 102/60    Pulse (!) 57    Temp (!) 95.6 F (35.3 C)    Resp 15    Ht 5\' 5"  (1.651 m)    Wt 141 lb 1.6 oz (64 kg)    SpO2 99%    BMI 23.48 kg/m   Physical Exam Vitals reviewed.  Constitutional:      Comments: Sedated, ventilated  HENT:     Head: Normocephalic.  Cardiovascular:     Rate and Rhythm: Regular rhythm. Bradycardia present.     Comments: (+) right CFA puncture site clean, dry, dressed appropriately without active bleeding or drainage. No pulsatile. No obvious pain with palpation although patient is sedated. Pulmonary:     Comments: Intubated, ventilated Skin:    General: Skin is warm and dry.     Imaging: DG Abd 1 View  Result Date: 04/22/2020 CLINICAL DATA:  Possible retained and ostomy capsule EXAM: ABDOMEN - 1  VIEW COMPARISON:  None. FINDINGS: Scattered large and small bowel gas is noted. No abnormal mass or abnormal calcifications are seen. No radiopaque foreign body is noted. Changes of scoliosis in the thoracolumbar spine and multiple pubic rami fractures are noted. IMPRESSION: No evidence of retained foreign body. Electronically Signed   By: Alcide Clever M.D.   On: 04/22/2020 16:55   MR ANGIO HEAD WO CONTRAST  Addendum Date: 04/21/2020   ADDENDUM REPORT: 04/21/2020 13:47 ADDENDUM: These results were called by telephone at the time of interpretation on 04/21/2020 at 1:46 pm to provider [facility] ,  who verbally acknowledged these results. Electronically Signed   By: [name] M.D.   On: 04/21/2020 13:47   Result Date: 04/21/2020 CLINICAL DATA:  Neuro deficit EXAM: MRI HEAD WITHOUT CONTRAST MRA HEAD WITHOUT CONTRAST TECHNIQUE: Multiplanar, multiecho pulse sequences of the brain and surrounding structures were obtained without intravenous contrast. Angiographic images of the head were obtained using MRA technique without contrast. COMPARISON:  04/21/2020 head CT. FINDINGS: Image quality is degraded by motion artifact. MRI HEAD FINDINGS Brain: Mild diffusion-weighted hyperintensity involving the posterior left frontal cortex and left periventricular white matter. Hemosiderin deposition involving the bilateral basal ganglia. No acute intracranial hemorrhage. Mild cerebral atrophy with ex vacuo dilatation. Mild chronic microvascular ischemic changes. Sequela of remote left corona radiata lacunar insults. No midline shift, ventriculomegaly or extra-axial fluid collection. No mass lesion. Vascular: Please see MRA head. Skull and upper cervical spine: Normal marrow signal. Sinuses/Orbits: Normal orbits. Sequela of bilateral lens replacement. Left maxillary sinus mucous retention cyst. No mastoid effusion. Other: None. MRA HEAD FINDINGS Anterior circulation: Patent normal caliber internal carotid and anterior cerebral arteries. Bilateral carotid siphon atherosclerotic disease without significant narrowing. Mild distal right A1 segment narrowing. Left M2 occlusion involving the inferior trunk. Moderate short-segment narrowing involving the superior left M2 trunk. Posterior circulation: Patent normal caliber appearance of the vertebral, basilar, AICA, and left posterior cerebral artery. Mild right P1 segment narrowing. Proximally patent bilateral superior cerebellar arteries. No significant stenosis, proximal occlusion, aneurysm, or vascular malformation. Venous sinuses: No evidence of thrombosis. Anatomic  variants: Bilateral posterior communicating arteries are either hypoplastic or absent. IMPRESSION: Acute/subacute insults involving the left frontal cortex and left periventricular white matter. Sequela of remote bilateral basal ganglia hemorrhages and remote left corona radiata insult. Mild cerebral atrophy and chronic microvascular ischemic changes. Left M2 occlusion involving the inferior trunk. Moderate short-segment narrowing of the superior left M2 trunk. Motion degraded exam. Electronically Signed: By: [name] M.D. On: 04/21/2020 13:37   MR BRAIN WO CONTRAST  Result Date: 04/22/2020 CLINICAL DATA:  Stroke follow-up.  Memory loss. EXAM: MRI HEAD WITHOUT CONTRAST TECHNIQUE: Multiplanar, multiecho pulse sequences of the brain and surrounding structures were obtained without intravenous contrast. COMPARISON:  Brain MRI 04/21/2020 FINDINGS: An abbreviated protocol was performed including axial and coronal diffusion-weighted imaging and axial T2 FLAIR sequence. There is a new area of abnormal diffusion restriction primarily within the left parietal lobe and mostly cortical/subcortical. No hemorrhage or mass effect. Unchanged moderate periventricular white matter hyperintensity. IMPRESSION: New area of acute/subacute cortical/subcortical ischemia predominantly within the left parietal lobe. Electronically Signed   By: Deatra Robinson M.D.   On: 04/22/2020 20:32   MR BRAIN WO CONTRAST  Addendum Date: 04/21/2020   ADDENDUM REPORT: 04/21/2020 13:47 ADDENDUM: These results were called by telephone at the time of interpretation on 04/21/2020 at 1:46 pm to provider [facility] , who verbally acknowledged these results. Electronically Signed   By: Allegra Lai  Emekauwa M.D.   On: 04/21/2020 13:47   Result Date: 04/21/2020 CLINICAL DATA:  Neuro deficit EXAM: MRI HEAD WITHOUT CONTRAST MRA HEAD WITHOUT CONTRAST TECHNIQUE: Multiplanar, multiecho pulse sequences of the brain and surrounding structures were  obtained without intravenous contrast. Angiographic images of the head were obtained using MRA technique without contrast. COMPARISON:  04/21/2020 head CT. FINDINGS: Image quality is degraded by motion artifact. MRI HEAD FINDINGS Brain: Mild diffusion-weighted hyperintensity involving the posterior left frontal cortex and left periventricular white matter. Hemosiderin deposition involving the bilateral basal ganglia. No acute intracranial hemorrhage. Mild cerebral atrophy with ex vacuo dilatation. Mild chronic microvascular ischemic changes. Sequela of remote left corona radiata lacunar insults. No midline shift, ventriculomegaly or extra-axial fluid collection. No mass lesion. Vascular: Please see MRA head. Skull and upper cervical spine: Normal marrow signal. Sinuses/Orbits: Normal orbits. Sequela of bilateral lens replacement. Left maxillary sinus mucous retention cyst. No mastoid effusion. Other: None. MRA HEAD FINDINGS Anterior circulation: Patent normal caliber internal carotid and anterior cerebral arteries. Bilateral carotid siphon atherosclerotic disease without significant narrowing. Mild distal right A1 segment narrowing. Left M2 occlusion involving the inferior trunk. Moderate short-segment narrowing involving the superior left M2 trunk. Posterior circulation: Patent normal caliber appearance of the vertebral, basilar, AICA, and left posterior cerebral artery. Mild right P1 segment narrowing. Proximally patent bilateral superior cerebellar arteries. No significant stenosis, proximal occlusion, aneurysm, or vascular malformation. Venous sinuses: No evidence of thrombosis. Anatomic variants: Bilateral posterior communicating arteries are either hypoplastic or absent. IMPRESSION: Acute/subacute insults involving the left frontal cortex and left periventricular white matter. Sequela of remote bilateral basal ganglia hemorrhages and remote left corona radiata insult. Mild cerebral atrophy and chronic  microvascular ischemic changes. Left M2 occlusion involving the inferior trunk. Moderate short-segment narrowing of the superior left M2 trunk. Motion degraded exam. Electronically Signed: By: [name] M.D. On: 04/21/2020 13:37   EEG adult  Result Date: 04/22/2020 Charlsie Quest, MD     04/22/2020 10:21 AM Patient Name: Tarynn Garling MRN: [id number] Epilepsy Attending: Charlsie Quest Referring Physician/Provider: Dr Milon Dikes Date: 04/22/2020 Duration: 23.40 mins Patient history: 86 year old female with sudden onset of change in mentation.  EEG to evaluate for seizures.  EEG to evaluate for seizure. Level of alertness: Awake, asleep AEDs during EEG study: None Technical aspects: This EEG study was done with scalp electrodes positioned according to the 10-20 International system of electrode placement. Electrical activity was acquired at a sampling rate of  and reviewed with a high frequency filter of  and a low frequency filter of . EEG data were recorded continuously and digitally stored. Description: The posterior dominant rhythm consists of 8-9 Hz activity of moderate voltage (25-35 uV) seen predominantly in posterior head regions, symmetric and reactive to eye opening and eye closing. Sleep was characterized by vertex waves, sleep spindles (12 to 14 Hz), maximal frontocentral region.  EEG showed continuous generalized 3 to 6 Hz theta-delta slowing.  Hyperventilation and photic stimulation were not performed.   ABNORMALITY -Continuous slow, generalized IMPRESSION: This study is suggestive of mild diffuse encephalopathy, nonspecific etiology. No seizures or epileptiform discharges were seen throughout the recording. Charlsie Quest   CT HEAD CODE STROKE WO CONTRAST  Result Date: 04/03/2020 CLINICAL DATA:  Code stroke.  Left-sided weakness EXAM: CT HEAD WITHOUT CONTRAST TECHNIQUE: Contiguous axial images were obtained from the base of the skull through the vertex without  intravenous contrast. COMPARISON:  04/21/2020 FINDINGS: Examination is severely motion degraded. Brain: There is no mass,  hemorrhage or extra-axial collection. The size and configuration of the ventricles and extra-axial CSF spaces are normal. The brain parenchyma is normal, without evidence of acute or chronic infarction. Vascular: No abnormal hyperdensity of the major intracranial arteries or dural venous sinuses. No intracranial atherosclerosis. Skull: The visualized skull base, calvarium and extracranial soft tissues are normal. Sinuses/Orbits: No fluid levels or advanced mucosal thickening of the visualized paranasal sinuses. No mastoid or middle ear effusion. The orbits are normal. ASPECTS [facility] Stroke Program Early CT Score) Cannot be fully scored due to degree of motion. Ganglionic level score is 7/7. IMPRESSION: Severely motion degraded study without acute intracranial abnormality. These results were communicated to Dr. Erick Blinks at 11:18 pm on 04/11/2020 by telephone. Electronically Signed   By: Deatra Robinson M.D.   On: 03/31/2020 23:18   CT HEAD CODE STROKE WO CONTRAST  Result Date: 04/21/2020 CLINICAL DATA:  Code stroke. Altered mental status and speech difficulty EXAM: CT HEAD WITHOUT CONTRAST TECHNIQUE: Contiguous axial images were obtained from the base of the skull through the vertex without intravenous contrast. COMPARISON:  None. FINDINGS: Brain: No evidence of acute infarction, hemorrhage, hydrocephalus, extra-axial collection or mass lesion/mass effect. Cerebral volume loss and mild chronic white matter disease for age. Vascular: No hyperdense vessel or unexpected calcification. Skull: Normal. Negative for fracture or focal lesion. Sinuses/Orbits: No acute finding. Other: These results were communicated to Dr. Wilford Corner at 12:20 pmon 9/26/2021by text page via the [facility] messaging system. ASPECTS [facility] Stroke Program Early CT Score) - Ganglionic level infarction (caudate, lentiform  nuclei, internal capsule, insula, M1-M3 cortex): 7 - Supraganglionic infarction (M4-M6 cortex): 3 Total score (0-10 with 10 being normal): 10 IMPRESSION: No acute finding.ASPECTS is 10. Electronically Signed   By: Marnee Spring M.D.   On: 04/21/2020 12:21   VAS US CAROTID  Result Date: 04/22/2020 Carotid Arterial Duplex Study Indications:       CVA, Speech disturbance and Ataxia. Risk Factors:      Hypertension, coronary artery disease. Other Factors:     A-fib. Comparison Study:  No prior study on file Performing Technologist: Sherren Kerns RVS  Examination Guidelines: A complete evaluation includes B-mode imaging, spectral Doppler, color Doppler, and power Doppler as needed of all accessible portions of each vessel. Bilateral testing is considered an integral part of a complete examination. Limited examinations for reoccurring indications may be performed as noted.  Right Carotid Findings: +----------+--------+--------+--------+------------------+---------+             PSV cm/s EDV cm/s Stenosis Plaque Description Comments   +----------+--------+--------+--------+------------------+---------+  CCA Prox   75       14                heterogenous                  +----------+--------+--------+--------+------------------+---------+  CCA Distal 66       12                heterogenous                  +----------+--------+--------+--------+------------------+---------+  ICA Prox   75       16                calcific           Shadowing  +----------+--------+--------+--------+------------------+---------+  ICA Distal 76       22                                              +----------+--------+--------+--------+------------------+---------+  ECA        83       16                                              +----------+--------+--------+--------+------------------+---------+ +----------+--------+-------+--------+-------------------+             PSV cm/s EDV cms Describe Arm Pressure (mmHG)   +----------+--------+-------+--------+-------------------+  Subclavian 93                                             +----------+--------+-------+--------+-------------------+ +---------+--------+--+--------+--+  Vertebral PSV cm/s 52 EDV cm/s 15  +---------+--------+--+--------+--+  Left Carotid Findings: +----------+--------+--------+--------+------------------+--------+             PSV cm/s EDV cm/s Stenosis Plaque Description Comments  +----------+--------+--------+--------+------------------+--------+  CCA Prox   84       16                homogeneous                  +----------+--------+--------+--------+------------------+--------+  CCA Distal 73       18                heterogenous                 +----------+--------+--------+--------+------------------+--------+  ICA Prox   56       18                heterogenous                 +----------+--------+--------+--------+------------------+--------+  ICA Distal 82       27                                             +----------+--------+--------+--------+------------------+--------+  ECA        84       12                                             +----------+--------+--------+--------+------------------+--------+ +----------+--------+--------+--------+-------------------+             PSV cm/s EDV cm/s Describe Arm Pressure (mmHG)  +----------+--------+--------+--------+-------------------+  Subclavian 60                                              +----------+--------+--------+--------+-------------------+ +---------+--------+--+--------+--+  Vertebral PSV cm/s 40 EDV cm/s 14  +---------+--------+--+--------+--+   Summary: Right Carotid: Velocities in the right ICA are consistent with a 1-39% stenosis. Left Carotid: Velocities in the left ICA are consistent with a 1-39% stenosis. Vertebrals:  Bilateral vertebral arteries demonstrate antegrade flow. Subclavians: Normal flow hemodynamics were seen in bilateral subclavian              arteries. *See  table(s) above for measurements and observations.  Electronically signed by Delia Heady MD on 04/22/2020 at 12:53:42 PM.    Final    CT ANGIO HEAD  CODE STROKE  Result Date: 04/22/2020 CLINICAL DATA:  Left-sided weakness EXAM: CT ANGIOGRAPHY HEAD AND NECK TECHNIQUE: Multidetector CT imaging of the head and neck was performed using the standard protocol during bolus administration of intravenous contrast. Multiplanar CT image reconstructions and MIPs were obtained to evaluate the vascular anatomy. Carotid stenosis measurements (when applicable) are obtained utilizing NASCET criteria, using the distal internal carotid diameter as the denominator. CONTRAST:  53mL OMNIPAQUE IOHEXOL 350 MG/ML SOLN COMPARISON:  None. FINDINGS: Motion degraded study. CTA NECK FINDINGS SKELETON: There is no bony spinal canal stenosis. No lytic or blastic lesion. OTHER NECK: Normal pharynx, larynx and major salivary glands. No cervical lymphadenopathy. Unremarkable thyroid gland. UPPER CHEST: No pneumothorax or pleural effusion. No nodules or masses. AORTIC ARCH: There is mild calcific atherosclerosis of the aortic arch. There is no aneurysm, dissection or hemodynamically significant stenosis of the visualized portion of the aorta. Conventional 3 vessel aortic branching pattern. The visualized proximal subclavian arteries are widely patent. RIGHT CAROTID SYSTEM: No dissection, occlusion or aneurysm. There is calcified atherosclerosis extending into the proximal ICA, resulting in 50% stenosis. LEFT CAROTID SYSTEM: Normal without aneurysm, dissection or stenosis. VERTEBRAL ARTERIES: Codominant configuration. Both origins are clearly patent. There is no dissection, occlusion or flow-limiting stenosis to the skull base (V1-V3 segments). CTA HEAD FINDINGS POSTERIOR CIRCULATION: --Vertebral arteries: Normal V4 segments. --Inferior cerebellar arteries: Normal. --Basilar artery: Normal. --Superior cerebellar arteries: Normal. --Posterior cerebral  arteries (PCA): Normal. ANTERIOR CIRCULATION: --Intracranial internal carotid arteries: Normal. --Anterior cerebral arteries (ACA): Normal. Both A1 segments are present. Patent anterior communicating artery (a-comm). --Middle cerebral arteries (MCA): There is multifocal occlusion of the right middle cerebral artery, including the proximal M1 segment, proximal M2 segment and distal M2. The left MCA is patent. VENOUS SINUSES: As permitted by contrast timing, patent. ANATOMIC VARIANTS: None Review of the MIP images confirms the above findings. IMPRESSION: 1. 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 2. No other intracranial occlusion. 3. 50% stenosis of the right internal carotid artery at its origin. 4.  Aortic atherosclerosis (ICD10-I70.0). Critical Value/emergent results were called by telephone at the time of interpretation on 04/07/2020 at 11:19 pm to provider [facility] , who verbally acknowledged these results. Electronically Signed   By: Deatra Robinson M.D.   On: 04/16/2020 23:29   CT ANGIO NECK CODE STROKE  Result Date: 04/03/2020 CLINICAL DATA:  Left-sided weakness EXAM: CT ANGIOGRAPHY HEAD AND NECK TECHNIQUE: Multidetector CT imaging of the head and neck was performed using the standard protocol during bolus administration of intravenous contrast. Multiplanar CT image reconstructions and MIPs were obtained to evaluate the vascular anatomy. Carotid stenosis measurements (when applicable) are obtained utilizing NASCET criteria, using the distal internal carotid diameter as the denominator. CONTRAST:  11mL OMNIPAQUE IOHEXOL 350 MG/ML SOLN COMPARISON:  None. FINDINGS: Motion degraded study. CTA NECK FINDINGS SKELETON: There is no bony spinal canal stenosis. No lytic or blastic lesion. OTHER NECK: Normal pharynx, larynx and major salivary glands. No cervical lymphadenopathy. Unremarkable thyroid gland. UPPER CHEST: No pneumothorax or pleural  effusion. No nodules or masses. AORTIC ARCH: There is mild calcific atherosclerosis of the aortic arch. There is no aneurysm, dissection or hemodynamically significant stenosis of the visualized portion of the aorta. Conventional 3 vessel aortic branching pattern. The visualized proximal subclavian arteries are widely patent. RIGHT CAROTID SYSTEM: No dissection, occlusion or aneurysm. There is calcified atherosclerosis extending into the proximal ICA, resulting in 50% stenosis. LEFT CAROTID SYSTEM: Normal without aneurysm, dissection  or stenosis. VERTEBRAL ARTERIES: Codominant configuration. Both origins are clearly patent. There is no dissection, occlusion or flow-limiting stenosis to the skull base (V1-V3 segments). CTA HEAD FINDINGS POSTERIOR CIRCULATION: --Vertebral arteries: Normal V4 segments. --Inferior cerebellar arteries: Normal. --Basilar artery: Normal. --Superior cerebellar arteries: Normal. --Posterior cerebral arteries (PCA): Normal. ANTERIOR CIRCULATION: --Intracranial internal carotid arteries: Normal. --Anterior cerebral arteries (ACA): Normal. Both A1 segments are present. Patent anterior communicating artery (a-comm). --Middle cerebral arteries (MCA): There is multifocal occlusion of the right middle cerebral artery, including the proximal M1 segment, proximal M2 segment and distal M2. The left MCA is patent. VENOUS SINUSES: As permitted by contrast timing, patent. ANATOMIC VARIANTS: None Review of the MIP images confirms the above findings. IMPRESSION: 1. 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 2. No other intracranial occlusion. 3. 50% stenosis of the right internal carotid artery at its origin. 4.  Aortic atherosclerosis (ICD10-I70.0). Critical Value/emergent results were called by telephone at the time of interpretation on 04/11/2020 at 11:19 pm to provider [facility] , who verbally acknowledged these results. Electronically  Signed   By: Deatra Robinson M.D.   On: 03/31/2020 23:29    Labs:  CBC: Recent Labs    May 03, 2020 1641 03-May-2020 1641 04/21/20 0530 04/22/20 0321 04/24/20 0311 04/24/20 0610  WBC 8.4  --  7.5 7.8  --  8.1  HGB 9.7*   < > 9.0* 9.6* 8.5* 8.9*  HCT 31.3*   < > 28.4* 30.8* 25.0* 29.0*  PLT 325  --  346 369  --  319   < > = values in this interval not displayed.    COAGS: Recent Labs    04/19/20 0000  INR 1.0    BMP: Recent Labs    05-03-20 0633 05/03/2020 0633 04/21/20 0530 04/22/20 0321 04/24/20 0311 04/24/20 0610  NA 139   < > 133* 133* 137 136  K 4.2   < > 3.8 4.2 4.0 3.8  CL 105  --  102 102  --  106  CO2 27  --  25 24  --  21*  GLUCOSE 89  --  95 95  --  158*  BUN 13  --  10 10  --  10  CALCIUM 8.8*  --  8.4* 8.6*  --  7.8*  CREATININE 1.07*  --  0.95 0.97  --  0.77  GFRNONAA 47*  --  54* 53*  --  >60  GFRAA 54*  --  >60 >60  --  >60   < > = values in this interval not displayed.    LIVER FUNCTION TESTS: No results for input(s): BILITOT, AST, ALT, ALKPHOS, PROT, ALBUMIN in the last 8760 hours.  Assessment and Plan:  86 y/o F who presented as a code stroke overnight noted to have occluded right MCA M1 segment s/p successful thrombectomy in NIR seen today for follow up.  Patient remains intubated, ventilated and sedated. She moves to painful stimuli only. Per RN plans to decrease sedation today to move towards possible extubation. Right CFA puncture site clean, dry, dressed appropriately, soft, non pulsatile.  Further plans per neurology service - NIR remains available as needed, please call with questions or concerns.  Electronically Signed: Villa Herb, PA-C 04/24/2020, 8:51 AM   I spent a total of 15 Minutes at the the patient's bedside AND on the patient's hospital floor or unit, greater than 50% of which was counseling/coordinating care for thrombectomy follow up.

## 2020-04-24 NOTE — Consult Note (Signed)
NAME:  Victoria Oconnell, MRN:  [id number], DOB:  [birth year], LOS: 5 ADMISSION DATE:  04/13/2020, CONSULTATION DATE: 04/24/2020 REFERRING MD:  Dr Corliss Skains, CHIEF COMPLAINT: New embolic CVA Brief History   86 year old female with right MCA CVA status post IR endovascular thrombectomy  History of present illness   This is an 86 year old white female that presented to the emergency room on 04/19/2020 with lower GI bleed.  She has a known history of atrial fibrillation has been on Xarelto.  She had had recurrent GI bleeds and this was stopped approximately 3 weeks ago.  Patient has dementia and inadvertently taken Xarelto again last week.  Shortly after admission she developed signs of a stroke on 04/21/2020.  She had remained aphasic but was able to follow simple commands.  MRI demonstrated a left parietal embolic infarct.  On 09/28/202, patient had acute neurologic decompensation.  The patient was completely flaccid on the left side of her body.  Code stroke was called.  She went to interventional radiology. Right MCA M1 segment was occluded, 50% stenosis the right ICA.  3 right groin site there were able to aspirate clot and had complete revascularization.  Patient was transferred to 4 N. and remained intubated.  Past Medical History  Atrial fibrillation Dementia  Significant Hospital Events   GI bleed Acute embolic CVA 09/26 Acute embolic CVA 09/28  Consults:  Neurology Gastroenterology IR  Procedures:  Endovascular thrombectomy 09/29 EEG 09/27 Small bowel endoscopy 09/25 Colonoscopy 09/25   Significant Diagnostic Tests:    Micro Data:  None  Antimicrobials:  None   Objective   Blood pressure (!) 154/74, pulse (!) 101, temperature 98.7 F (37.1 C), temperature source Oral, resp. rate 20, height 5\' 5"  (1.651 m), weight 64 kg, SpO2 96 %.        Intake/Output Summary (Last 24 hours) at 04/24/2020 0230 Last data filed at 04/24/2020 0143 Gross per 24 hour  Intake 1060 ml    Output 100 ml  Net 960 ml   Filed Weights   03/28/2020 0709 04/15/2020 0253  Weight: 61.2 kg 64 kg    Examination: General: Intubated and heavily sedated.  No acute distress HENT: Atraumatic/normocephalic mucous membranes are moist orally intubated. Lungs: CTAB no wheezing rales or rhonchi noted Cardiovascular: Irregularly irregular rate controlled 1/6 systolic ejection murmur Abdomen: Soft, nondistended, positive bowel sounds-hypoactive, no rebound/review/guarding the limited by neurologic status. Extremities: Distal pulse intact x4.  No significant edema.  Skin is warm to touch. Neuro: Unconscious/unresponsive.  Pupils are equal and reactive.  Patient remains under the effects of operative sedation. GU: Pure wick catheter in place.   Assessment & Plan:  Acute respiratory failure Status post endovascular thrombectomy interventional radiology Acute right MCA thrombotic CVA Atrial fibrillation rate controlled Recent GI bleed Acute blood loss anemia Dementia  Plan: Patient is just arrived to the neurosurgical intensive care unit. When she is settled in the perioperative anesthesia is worn off we will start spontaneous awakening trials. Ultimately will move towards spontaneous breathing trials but in the meantime we use standard ventilation till it is appropriate to start weaning. Patient was started on propofol will be continued.  Use fentanyl as needed Cleviprex was also started if the patient requires continued use of this would recommend changing to Cardene due to the dual lipids of propofol and Cleviprex. Heart rate is currently controlled we will continue digoxin. Monitor I's/O's.   Best practice:  Diet: N.p.o. Pain/Anxiety/Delirium protocol (if indicated): Propofol and as needed fentanyl VAP protocol (if indicated):  Initiated DVT prophylaxis: SCDs only GI prophylaxis: Protonix Glucose control: Continue to monitor  mobility: Bedrest Code Status: DNR Family  Communication:  Disposition: Neurosurgical intensive care unit  Labs   CBC: Recent Labs  Lab 04/19/20 1649 05-01-2020 0633 2020-05-01 1641 04/21/20 0530 04/22/20 0321  WBC 6.9 6.7 8.4 7.5 7.8  HGB 9.9* 10.0* 9.7* 9.0* 9.6*  HCT 30.6* 31.8* 31.3* 28.4* 30.8*  MCV 89.5 89.6 89.9 91.0 89.3  PLT 336 323 325 346 369    Basic Metabolic Panel: Recent Labs  Lab 04/19/20 0000 05/01/2020 0633 04/21/20 0530 04/22/20 0321  NA 132* 139 133* 133*  K 4.7 4.2 3.8 4.2  CL 95* 105 102 102  CO2 27 27 25 24   GLUCOSE 119* 89 95 95  BUN 21 13 10 10   CREATININE 1.25* 1.07* 0.95 0.97  CALCIUM 9.1 8.8* 8.4* 8.6*   GFR: Estimated Creatinine Clearance: 36.8 mL/min (by C-G formula based on SCr of 0.97 mg/dL). Recent Labs  Lab 05/01/20 0633 01-May-2020 1641 04/21/20 0530 04/22/20 0321  WBC 6.7 8.4 7.5 7.8    Liver Function Tests: No results for input(s): AST, ALT, ALKPHOS, BILITOT, PROT, ALBUMIN in the last 168 hours. No results for input(s): LIPASE, AMYLASE in the last 168 hours. No results for input(s): AMMONIA in the last 168 hours.  ABG No results found for: PHART, PCO2ART, PO2ART, HCO3, TCO2, ACIDBASEDEF, O2SAT   Coagulation Profile: Recent Labs  Lab 04/19/20 0000  INR 1.0    Cardiac Enzymes: No results for input(s): CKTOTAL, CKMB, CKMBINDEX, TROPONINI in the last 168 hours.  HbA1C: Hgb A1c MFr Bld  Date/Time Value Ref Range Status  04/21/2020 05:30 AM 5.3 4.8 - 5.6 % Final    Comment:    (NOTE) Pre diabetes:          5.7%-6.4%  Diabetes:              >6.4%  Glycemic control for   <7.0% adults with diabetes     CBG: Recent Labs  Lab 04/21/20 1122 04/16/2020 2209  GLUCAP 100* 133*    Review of Systems:   Unable to be completed secondary neurologic status.  Past Medical History  She,  has a past medical history of Asthma, Atrial fibrillation (HCC), CAD (coronary artery disease), CHF (congestive heart failure) (HCC), Dementia (HCC), Dependence on nocturnal oxygen  therapy, Diverticulosis, Essential hypertension, and GI bleeding (04/19/2020).   Surgical History    Past Surgical History:  Procedure Laterality Date  . ABDOMINAL HYSTERECTOMY    . BREAST LUMPECTOMY    . COLONOSCOPY WITH PROPOFOL N/A 01-May-2020   Procedure: COLONOSCOPY WITH PROPOFOL;  Surgeon: 04/21/2020, MD;  Location: [facility] ENDOSCOPY;  Service: Endoscopy;  Laterality: N/A;  . ENTEROSCOPY N/A 01-May-2020   Procedure: ENTEROSCOPY;  Surgeon: [facility], MD;  Location: [facility] ENDOSCOPY;  Service: Endoscopy;  Laterality: N/A;  . GIVENS CAPSULE STUDY N/A 2020/05/01   Procedure: GIVENS CAPSULE STUDY;  Surgeon: [facility], MD;  Location: [facility] ENDOSCOPY;  Service: Endoscopy;  Laterality: N/A;     Social History   reports that she has never smoked. She has never used smokeless tobacco. She reports that she does not drink alcohol and does not use drugs.   Family History   Her family history is not on file.   Allergies Allergies  Allergen Reactions  . Contrast Media [Iodinated Diagnostic Agents]     Pt doesn't remember      Home Medications  Prior to Admission medications   Medication Sig Start  Date End Date Taking? Authorizing Provider  albuterol (VENTOLIN HFA) 108 (90 Base) MCG/ACT inhaler Inhale 2 puffs into the lungs every 4 (four) hours as needed for wheezing or shortness of breath.   Yes [provider]  aspirin EC 81 MG tablet Take 81 mg by mouth daily. Swallow whole.   Yes [provider]  bisoprolol (ZEBETA) 10 MG tablet Take 10 mg by mouth daily.   Yes [provider]  budesonide-formoterol (SYMBICORT) 160-4.5 MCG/ACT inhaler Inhale 2 puffs into the lungs 2 (two) times daily.   Yes [provider]  digoxin (LANOXIN) 0.125 MG tablet Take 0.125 mg by mouth daily.   Yes [provider]  FLUoxetine (PROZAC) 40 MG capsule Take 40 mg by mouth daily.   Yes [provider]  fluticasone (FLOVENT DISKUS) 50 MCG/BLIST diskus inhaler Inhale  2 puffs into the lungs 2 (two) times daily.   Yes [provider]  furosemide (LASIX) 20 MG tablet Take 20 mg by mouth daily.   Yes [provider]  levothyroxine (SYNTHROID) 88 MCG tablet Take 88 mcg by mouth daily before breakfast.   Yes [provider]  losartan (COZAAR) 50 MG tablet Take 50 mg by mouth daily.   Yes [provider]  magnesium hydroxide (MILK OF MAGNESIA) 400 MG/5ML suspension Take 30 mLs by mouth daily as needed for mild constipation.   Yes [provider]  omeprazole (PRILOSEC) 20 MG capsule Take 20 mg by mouth daily.   Yes [provider]  pravastatin (PRAVACHOL) 40 MG tablet Take 40 mg by mouth at bedtime.   Yes [provider]  simethicone (MYLICON) 125 MG chewable tablet Chew 125 mg by mouth 3 (three) times daily as needed for flatulence.   Yes [provider]  vitamin B-12 (CYANOCOBALAMIN) 100 MCG tablet Take 100 mcg by mouth daily.   Yes [provider]     Critical care time: 45 minutes

## 2020-04-24 NOTE — Anesthesia Postprocedure Evaluation (Addendum)
Anesthesia Post Note  Patient: Victoria Oconnell  Procedure(s) Performed: IR WITH ANESTHESIA (N/A )     Patient location during evaluation: SICU Anesthesia Type: General Level of consciousness: sedated Pain management: pain level controlled Vital Signs Assessment: post-procedure vital signs reviewed and stable Respiratory status: patient remains intubated per anesthesia plan Cardiovascular status: stable Postop Assessment: no apparent nausea or vomiting Anesthetic complications: no   No complications documented.  Last Vitals:  Vitals:   04/24/20 0400 04/24/20 0405  BP:    Pulse: (!) 49 (!) 54  Resp: 15 17  Temp:    SpO2: 100% 100%    Last Pain:  Vitals:   04/07/2020 2207  TempSrc: Oral  PainSc:                  [place]ale Sherburn

## 2020-04-24 NOTE — Procedures (Signed)
S/P RT commoon carotid arteriogram  With complete revascularization of occluded Rt MCA M 1 seg with x 1 pass with 5 mm x 71mm embotrap retriever and  Aspiration achieving a TICI 3 revascularization. Post procedure CT brain No ICH or mass effect. 22F exoseal applied to RT groin puncture for hemostasis. Distal pulses all dopplerable. Patient left intubated to protect airway. S.Sylver Vantassell MD

## 2020-04-24 NOTE — Plan of Care (Signed)
  Problem: Clinical Measurements: Goal: Complications related to the disease process, condition or treatment will be avoided or minimized Outcome: Not Progressing   Problem: Education: Goal: Knowledge of disease or condition will improve Outcome: Not Progressing Goal: Knowledge of secondary prevention will improve Outcome: Not Progressing Goal: Knowledge of patient specific risk factors addressed and post discharge goals established will improve Outcome: Not Progressing

## 2020-04-24 NOTE — Procedures (Signed)
Extubation Procedure Note  Patient Details:   Name: Victoria Oconnell DOB: 11-01-1929 MRN: [id number]    Patient terminally extubated per order.  RN at bedside.  No complications noted.   Phill Myron 04/24/2020, 9:47 PM

## 2020-04-24 NOTE — Anesthesia Procedure Notes (Addendum)
Arterial Line Insertion Start/End9/29/2021 12:09 AM, 04/24/2020 12:12 AM Performed by: Val Eagle, MD, anesthesiologist  Patient location: OR. Preanesthetic checklist: patient identified, IV checked, site marked, risks and benefits discussed, surgical consent, monitors and equipment checked, pre-op evaluation, timeout performed and anesthesia consent Left, radial was placed Catheter size: 20 G Hand hygiene performed  and maximum sterile barriers used   Attempts: 1 Procedure performed without using ultrasound guided technique. Following insertion, dressing applied and Biopatch. Post procedure assessment: normal and unchanged  Patient tolerated the procedure well with no immediate complications.

## 2020-04-24 NOTE — Progress Notes (Signed)
NAME:  Victoria Oconnell, MRN:  [id number], DOB:  06/14/1930, LOS: 5 ADMISSION DATE:  03/28/2020, CONSULTATION DATE:  9/29 REFERRING MD:  Corliss Skains, CHIEF COMPLAINT:  GI bleeding  Brief History   86 year old female with right MCA CVA status post IR endovascular thrombectomy.   Past Medical History  Atrial fibrillation Dementia  Significant Hospital Events   GI bleed Acute embolic CVA 09/26 Acute embolic CVA 09/28  Consults:  Neurology Gastroenterology IR  Procedures:  Colonoscopy 09/25 Small bowel endoscopy 09/25 EEG 09/27 Endovascular thrombectomy 09/29  Significant Diagnostic Tests:  9/26 CT head > No acute finding.ASPECTS is 10. 9/26 MRI angio brain > acute subacute insults involving frontal cortex and left periventricular white matter, remote bilateral basal ganglia hemorrhages, mild cerebral atrophy, left M2 occlusion involving inferior trunk 9/27 Echo with bubble study >>> pending 9/28 CT head > severe motion degraded study without acute iintracranial abnormality 9/29 CT angiogram head/neck> R MCA occlusion, no other intracranial occlusion, 50% stenosis of R ICA  Micro Data:  9/24 sars cov 2/influenza a/b > neg  Antimicrobials:    Interim history/subjective:  Remains mechanically ventilated For MRI brain this morning  Objective   Blood pressure 109/64, pulse (!) 38, temperature (!) 97 F (36.1 C), temperature source Axillary, resp. rate 15, height 5\' 5"  (1.651 m), weight 64 kg, SpO2 100 %.    Vent Mode: PRVC FiO2 (%):  [40 %-100 %] 40 % Set Rate:  [15 bmp] 15 bmp Vt Set:  [460 mL] 460 mL PEEP:  [5 cmH20] 5 cmH20 Plateau Pressure:  [16 cmH20] 16 cmH20   Intake/Output Summary (Last 24 hours) at 04/24/2020 04/26/2020 Last data filed at 04/24/2020 0700 Gross per 24 hour  Intake 2047.62 ml  Output 100 ml  Net 1947.62 ml   Filed Weights   04/21/2020 0709 05/08/20 0253  Weight: 61.2 kg 64 kg    Examination:  General:  In bed on vent HENT: NCAT ETT in  place PULM: CTA B, vent supported breathing CV: RRR, no mgr GI: BS+, soft, nontender MSK: normal bulk and tone Neuro: sedated on vent    Resolved Hospital Problem list     Assessment & Plan:  Acute respiratory failure with hypoxemia due to inability to protect airway Full mechanical vent support VAP prevention Daily WUA/SBT Consider weaning after goals of care conversation after MRI brain  Acute R MCA thrombotic CVA, previous left sided stroke with aphasia this admission; prognosis poor S/P endovascular thrombectomy by IR Dementia baseline MRI brain today  Goals of care conversation after MRI brain Secondary stroke prevention, hypertension management  Cleviprex per neuro IR SBP goal D/c bisoprolol  Recent GI bleed> no active bleeding now Acute blood loss anemia Monitor for bleeding Transfuse PRBC for Hgb < 7 gm/dL  Labile hypertension/hypotension Stop propofol  Need for sedation for mechanical ventilation RASS target 0 to -1 Change to fentanyl infusion given labile blood pressure   Best practice:  Diet: consider tube feeding later today depending on goals of care conversation Pain/Anxiety/Delirium protocol (if indicated): as above VAP protocol (if indicated): yes DVT prophylaxis: scd GI prophylaxis: Pantoprazole for stress ulcer prophylaxis Glucose control: SSI Mobility: bed rest Code Status: full Family Communication: per neuro Disposition: remain in ICU  Labs   CBC: Recent Labs  Lab 04/24/2020 0633 04/19/2020 0633 04/21/2020 1641 04/21/20 0530 04/22/20 0321 04/24/20 0311 04/24/20 0610  WBC 6.7  --  8.4 7.5 7.8  --  8.1  NEUTROABS  --   --   --   --   --   --  7.4  HGB 10.0*   < > 9.7* 9.0* 9.6* 8.5* 8.9*  HCT 31.8*   < > 31.3* 28.4* 30.8* 25.0* 29.0*  MCV 89.6  --  89.9 91.0 89.3  --  90.3  PLT 323  --  325 346 369  --  319   < > = values in this interval not displayed.    Basic Metabolic Panel: Recent Labs  Lab 04/19/20 0000 04/13/2020 0633  04/21/20 0530 04/22/20 0321 04/24/20 0311  NA 132* 139 133* 133* 137  K 4.7 4.2 3.8 4.2 4.0  CL 95* 105 102 102  --   CO2 27 27 25 24   --   GLUCOSE 119* 89 95 95  --   BUN 21 13 10 10   --   CREATININE 1.25* 1.07* 0.95 0.97  --   CALCIUM 9.1 8.8* 8.4* 8.6*  --    GFR: Estimated Creatinine Clearance: 36.8 mL/min (by C-G formula based on SCr of 0.97 mg/dL). Recent Labs  Lab 04/24/2020 1641 04/21/20 0530 04/22/20 0321 04/24/20 0610  WBC 8.4 7.5 7.8 8.1    Liver Function Tests: No results for input(s): AST, ALT, ALKPHOS, BILITOT, PROT, ALBUMIN in the last 168 hours. No results for input(s): LIPASE, AMYLASE in the last 168 hours. No results for input(s): AMMONIA in the last 168 hours.  ABG    Component Value Date/Time   PHART 7.475 (H) 04/24/2020 0311   PCO2ART 34.0 04/24/2020 0311   PO2ART 348 (H) 04/24/2020 0311   HCO3 25.1 04/24/2020 0311   TCO2 26 04/24/2020 0311   O2SAT 100.0 04/24/2020 0311     Coagulation Profile: Recent Labs  Lab 04/19/20 0000  INR 1.0    Cardiac Enzymes: No results for input(s): CKTOTAL, CKMB, CKMBINDEX, TROPONINI in the last 168 hours.  HbA1C: Hgb A1c MFr Bld  Date/Time Value Ref Range Status  04/21/2020 05:30 AM 5.3 4.8 - 5.6 % Final    Comment:    (NOTE) Pre diabetes:          5.7%-6.4%  Diabetes:              >6.4%  Glycemic control for   <7.0% adults with diabetes     CBG: Recent Labs  Lab 04/21/20 1122 04/13/2020 2209  GLUCAP 100* 133*     Critical care time: 35 minutes     04/05/2020, MD [facility] PCCM Pager: [phone] Cell: [phone] If no response, call [phone]

## 2020-04-24 NOTE — Anesthesia Procedure Notes (Signed)
Procedure Name: Intubation Date/Time: 04/24/2020 12:01 AM Performed by: Claudina Lick, CRNA Pre-anesthesia Checklist: Patient identified, Emergency Drugs available, Suction available, Patient being monitored and Timeout performed Patient Re-evaluated:Patient Re-evaluated prior to induction Oxygen Delivery Method: Circle system utilized Preoxygenation: Pre-oxygenation with 100% oxygen Induction Type: Rapid sequence and Cricoid Pressure applied Laryngoscope Size: Miller and 2 Grade View: Grade I Tube size: 8.0 mm Airway Equipment and Method: Stylet Placement Confirmation: ETT inserted through vocal cords under direct vision,  positive ETCO2 and breath sounds checked- equal and bilateral Secured at: 24 cm Tube secured with: Tape Dental Injury: Teeth and Oropharynx as per pre-operative assessment

## 2020-04-24 NOTE — Progress Notes (Signed)
Patient attended in the room complaining of headache pointing in the right side of the head.On assessment the patient left arm and left leg is flaccid,and facial droop to the left.Patient is totally disoriented.Patient was seen alert and oriented X2,assisted to the bathroom 30 minutes(around 2130 PM) prior to this event.Charge Nurse,and rapid response nurse notified and code stroke was activated.

## 2020-04-24 NOTE — Transfer of Care (Signed)
Immediate Anesthesia Transfer of Care Note  Patient: Victoria Oconnell  Procedure(s) Performed: IR WITH ANESTHESIA (N/A )  Patient Location: ICU  Anesthesia Type:General  Level of Consciousness: sedated and Patient remains intubated per anesthesia plan  Airway & Oxygen Therapy: Patient remains intubated per anesthesia plan and Patient placed on Ventilator (see vital sign flow sheet for setting)  Post-op Assessment: Report given to RN and Post -op Vital signs reviewed and stable  Post vital signs: Reviewed and stable  Last Vitals:  Vitals Value Taken Time  BP 152/88 04/24/20 0235  Temp    Pulse 81 04/24/20 0235  Resp 15 04/24/20 0235  SpO2 100 % 04/24/20 0235    Last Pain:  Vitals:   May 15, 2020 2207  TempSrc: Oral  PainSc:          Complications: No complications documented.

## 2020-04-24 NOTE — Progress Notes (Signed)
Pt transported from room 4N29 to MRI an back on vent with no complications.

## 2020-04-24 NOTE — Plan of Care (Signed)
See Neurology progress note 04/24/20 for details--  Family has indicated to Dr. Pearlean Brownie they wish for compassionate transition to comfort care this evening upon arrival.  PCCM asked to place comfort care order set.  Comfort care orders are signed and held, to be released by physician when family arrives and is ready.  Please call ELink for order release when appropriate tonight.    Tessie Fass MSN, AGACNP-BC [place] [facility] [phone] 04/24/2020, 5:03 PM

## 2020-04-24 NOTE — Progress Notes (Signed)
eLink Physician-Brief Progress Note Patient Name: Victoria Oconnell DOB: [birth year] MRN: [id number]   Date of Service  04/24/2020  HPI/Events of Note  Patient hypertensive on arrival to ICU. Cleviprex started and BP bottom out = 88/48 with MAP = 60.   eICU Interventions  Will order: 1. Phenylephrine IV infusion via PIV. Titrate to SBP = 120-140. 2. Bolus with 0.9 NaCL 1 liter IV over 1 hour now.      Intervention Category Major Interventions: Hypotension - evaluation and management  Tempest Frankland Eugene 04/24/2020, 3:06 AM

## 2020-04-24 NOTE — Progress Notes (Signed)
Patients BP is extremely labile, switching between cleviprex and Neo within minutes. See flowsheets for documentation. Goal SBP 120-140, will continue to titrate as needed.

## 2020-04-24 NOTE — Code Documentation (Deleted)
Called to bedside for stroke like symptoms  LKW 2145  Patient aphasic, neglecting left side, facial droop, dysarthria and sensory deficit  NIH 20  Transported to CT  Then transported to IR

## 2020-04-24 NOTE — Progress Notes (Signed)
STROKE TEAM PROGRESS NOTE   INTERVAL HISTORY Events of last night noted. Patient unfortunately had another stroke event with right M1 occlusion requiring emergent thrombectomy and she remains intubated and sedated post procedure. He remains aphasic and is not following commands but is moving all 4 extremities left more than right. Blood pressures controlled adequately.  Vitals:   04/24/20 0645 04/24/20 0700 04/24/20 0800 04/24/20 0826  BP: (!) 120/57 109/64 102/60   Pulse: 64 (!) 38 (!) 57   Resp: 15 15 15    Temp:  (!) 95.6 F (35.3 C) (!) 95.6 F (35.3 C)   TempSrc:      SpO2: 100% 100% 100% 99%  Weight:      Height:       CBC:  Recent Labs  Lab 04/22/20 0321 04/22/20 0321 04/24/20 0311 04/24/20 0610  WBC 7.8  --   --  8.1  NEUTROABS  --   --   --  7.4  HGB 9.6*   < > 8.5* 8.9*  HCT 30.8*   < > 25.0* 29.0*  MCV 89.3  --   --  90.3  PLT 369  --   --  319   < > = values in this interval not displayed.   Basic Metabolic Panel:  Recent Labs  Lab 04/22/20 0321 04/22/20 0321 04/24/20 0311 04/24/20 0610  NA 133*   < > 137 136  K 4.2   < > 4.0 3.8  CL 102  --   --  106  CO2 24  --   --  21*  GLUCOSE 95  --   --  158*  BUN 10  --   --  10  CREATININE 0.97  --   --  0.77  CALCIUM 8.6*  --   --  7.8*   < > = values in this interval not displayed.   Lipid Panel:  Recent Labs  Lab 04/21/20 0530 04/21/20 0530 04/24/20 0610  CHOL 105  --   --   TRIG 58   < > 68  HDL 39*  --   --   CHOLHDL 2.7  --   --   VLDL 12  --   --   LDLCALC 54  --   --    < > = values in this interval not displayed.   HgbA1c:  Recent Labs  Lab 04/21/20 0530  HGBA1C 5.3   Urine Drug Screen: No results for input(s): LABOPIA, COCAINSCRNUR, LABBENZ, AMPHETMU, THCU, LABBARB in the last 168 hours.  Alcohol Level No results for input(s): ETH in the last 168 hours.  IMAGING past 24 hours CT HEAD CODE STROKE WO CONTRAST  Result Date: 04/06/2020 CLINICAL DATA:  Code stroke.  Left-sided  weakness EXAM: CT HEAD WITHOUT CONTRAST TECHNIQUE: Contiguous axial images were obtained from the base of the skull through the vertex without intravenous contrast. COMPARISON:  04/21/2020 FINDINGS: Examination is severely motion degraded. Brain: There is no mass, hemorrhage or extra-axial collection. The size and configuration of the ventricles and extra-axial CSF spaces are normal. The brain parenchyma is normal, without evidence of acute or chronic infarction. Vascular: No abnormal hyperdensity of the major intracranial arteries or dural venous sinuses. No intracranial atherosclerosis. Skull: The visualized skull base, calvarium and extracranial soft tissues are normal. Sinuses/Orbits: No fluid levels or advanced mucosal thickening of the visualized paranasal sinuses. No mastoid or middle ear effusion. The orbits are normal. ASPECTS [facility] Stroke Program Early CT Score) Cannot be fully scored due to  degree of motion. Ganglionic level score is 7/7. IMPRESSION: Severely motion degraded study without acute intracranial abnormality. These results were communicated to Dr. Erick Blinks at 11:18 pm on 04/09/2020 by telephone. Electronically Signed   By: Deatra Robinson M.D.   On: 04/01/2020 23:18   CT ANGIO HEAD CODE STROKE  Result Date: 04/24/2020 CLINICAL DATA:  Left-sided weakness EXAM: CT ANGIOGRAPHY HEAD AND NECK TECHNIQUE: Multidetector CT imaging of the head and neck was performed using the standard protocol during bolus administration of intravenous contrast. Multiplanar CT image reconstructions and MIPs were obtained to evaluate the vascular anatomy. Carotid stenosis measurements (when applicable) are obtained utilizing NASCET criteria, using the distal internal carotid diameter as the denominator. CONTRAST:  22mL OMNIPAQUE IOHEXOL 350 MG/ML SOLN COMPARISON:  None. FINDINGS: Motion degraded study. CTA NECK FINDINGS SKELETON: There is no bony spinal canal stenosis. No lytic or blastic lesion. OTHER NECK:  Normal pharynx, larynx and major salivary glands. No cervical lymphadenopathy. Unremarkable thyroid gland. UPPER CHEST: No pneumothorax or pleural effusion. No nodules or masses. AORTIC ARCH: There is mild calcific atherosclerosis of the aortic arch. There is no aneurysm, dissection or hemodynamically significant stenosis of the visualized portion of the aorta. Conventional 3 vessel aortic branching pattern. The visualized proximal subclavian arteries are widely patent. RIGHT CAROTID SYSTEM: No dissection, occlusion or aneurysm. There is calcified atherosclerosis extending into the proximal ICA, resulting in 50% stenosis. LEFT CAROTID SYSTEM: Normal without aneurysm, dissection or stenosis. VERTEBRAL ARTERIES: Codominant configuration. Both origins are clearly patent. There is no dissection, occlusion or flow-limiting stenosis to the skull base (V1-V3 segments). CTA HEAD FINDINGS POSTERIOR CIRCULATION: --Vertebral arteries: Normal V4 segments. --Inferior cerebellar arteries: Normal. --Basilar artery: Normal. --Superior cerebellar arteries: Normal. --Posterior cerebral arteries (PCA): Normal. ANTERIOR CIRCULATION: --Intracranial internal carotid arteries: Normal. --Anterior cerebral arteries (ACA): Normal. Both A1 segments are present. Patent anterior communicating artery (a-comm). --Middle cerebral arteries (MCA): There is multifocal occlusion of the right middle cerebral artery, including the proximal M1 segment, proximal M2 segment and distal M2. The left MCA is patent. VENOUS SINUSES: As permitted by contrast timing, patent. ANATOMIC VARIANTS: None Review of the MIP images confirms the above findings. IMPRESSION: 1. 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 2. No other intracranial occlusion. 3. 50% stenosis of the right internal carotid artery at its origin. 4.  Aortic atherosclerosis (ICD10-I70.0). Critical Value/emergent results were called by telephone  at the time of interpretation on 03/31/2020 at 11:19 pm to provider [facility] , who verbally acknowledged these results. Electronically Signed   By: Deatra Robinson M.D.   On: 04/12/2020 23:29   CT ANGIO NECK CODE STROKE  Result Date: 04/24/2020 CLINICAL DATA:  Left-sided weakness EXAM: CT ANGIOGRAPHY HEAD AND NECK TECHNIQUE: Multidetector CT imaging of the head and neck was performed using the standard protocol during bolus administration of intravenous contrast. Multiplanar CT image reconstructions and MIPs were obtained to evaluate the vascular anatomy. Carotid stenosis measurements (when applicable) are obtained utilizing NASCET criteria, using the distal internal carotid diameter as the denominator. CONTRAST:  75mL OMNIPAQUE IOHEXOL 350 MG/ML SOLN COMPARISON:  None. FINDINGS: Motion degraded study. CTA NECK FINDINGS SKELETON: There is no bony spinal canal stenosis. No lytic or blastic lesion. OTHER NECK: Normal pharynx, larynx and major salivary glands. No cervical lymphadenopathy. Unremarkable thyroid gland. UPPER CHEST: No pneumothorax or pleural effusion. No nodules or masses. AORTIC ARCH: There is mild calcific atherosclerosis of the aortic arch. There is no aneurysm, dissection or hemodynamically  significant stenosis of the visualized portion of the aorta. Conventional 3 vessel aortic branching pattern. The visualized proximal subclavian arteries are widely patent. RIGHT CAROTID SYSTEM: No dissection, occlusion or aneurysm. There is calcified atherosclerosis extending into the proximal ICA, resulting in 50% stenosis. LEFT CAROTID SYSTEM: Normal without aneurysm, dissection or stenosis. VERTEBRAL ARTERIES: Codominant configuration. Both origins are clearly patent. There is no dissection, occlusion or flow-limiting stenosis to the skull base (V1-V3 segments). CTA HEAD FINDINGS POSTERIOR CIRCULATION: --Vertebral arteries: Normal V4 segments. --Inferior cerebellar arteries: Normal. --Basilar artery:  Normal. --Superior cerebellar arteries: Normal. --Posterior cerebral arteries (PCA): Normal. ANTERIOR CIRCULATION: --Intracranial internal carotid arteries: Normal. --Anterior cerebral arteries (ACA): Normal. Both A1 segments are present. Patent anterior communicating artery (a-comm). --Middle cerebral arteries (MCA): There is multifocal occlusion of the right middle cerebral artery, including the proximal M1 segment, proximal M2 segment and distal M2. The left MCA is patent. VENOUS SINUSES: As permitted by contrast timing, patent. ANATOMIC VARIANTS: None Review of the MIP images confirms the above findings. IMPRESSION: 1. 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 2. No other intracranial occlusion. 3. 50% stenosis of the right internal carotid artery at its origin. 4.  Aortic atherosclerosis (ICD10-I70.0). Critical Value/emergent results were called by telephone at the time of interpretation on 04/02/2020 at 11:19 pm to provider [facility] , who verbally acknowledged these results. Electronically Signed   By: Deatra Robinson M.D.   On: 04/01/2020 23:29    PHYSICAL EXAM   Frail elderly Caucasian lady who is intubated and sedated.. Afebrile. Head is nontraumatic. Neck is supple without bruit.    Cardiac exam no murmur or gallop. Lungs are clear to auscultation. Distal pulses are well felt. Neurological Exam : She is sedated and intubated. Eyes are closed. She has right gaze preference.. She does not follow any commands. She does not blink to threat on both sides. Doll's eye movements are sluggish.  Face is symmetric.  Tongue midline.  Motor system exam able to move all 4 extremities to painful stimulus but moves right side more than left.  .  Gait not tested.   ASSESSMENT/PLAN Victoria Oconnell is a 86 y.o. female with history of atrial fibrillation not on anticoagulation due to bleeding being considered for a watchman device for stroke  prevention, mild dementia, coronary artery disease, congestive heart failure, hypertension, being treated in the hospital for a GI bleed who developed sudden change in mentation and speech.   Stroke:  2nd inhospital stroke 04/05/2020 - R MCA infarct due to R M1 occlusion from AF not on [facility], sent to IR w/ TICI3 revascularization   CT Code stroke 9/28 motion - no acute abnormality  CTA head & neck 9/28 multifocal occlusion R MCA including M1. R ICA origin 50% stenosis. Aortic atherosclerosis.   Cerebral angio / IR 9/29 TICI3 revascularization R M1 w/ embotrap   Post IR CT 9/29 no ICH or mass effect   MRI brain 9/29 new anterior R temporal lobe, insula and basal ganglia infarct. Stable L parieto-occipital and temporal infarct. Small vessel disease.   VTE prophylaxis - SCDs   aspirin 81 mg daily prior to admission, now on No antithrombotic given anemia requiring transfusion  Therapy recommendations:  HH PT, HH OT, HH SLP   Disposition:  pending   DNR - reversed for IR    Stroke:  1st inhospital stroke 9/26 - Large left parietal embolic infarct d/t AF not on AC. Also has a subacute L  subcortical infarct likely age-indeterminate and from small vessel disease.    Code Stroke CT head No acute abnormality. ASPECTS 10.     MRI / MRA  Tiny Acute/subacute L frontal cortex and L periventricular white matter infarcts. Old B basal ganglia hemorrhages. Old L corona radiata infarct. Small vessel disease. Atrophy. L M2 occlusion.  Repeat limited MRI 9/27 new large acute/subacute cortical / subcortical L parietal lobe infarct    Carotid Doppler  B ICA 1-39% stenosis, VAs antegrade   2D Echo w/ bubble pending   EEG generalized slowing  LDL 54  HgbA1c 5.3  Acute Respiratory Failure  Intubated for IR, left intubated to protect airway  Sedated  CCM on board  Need family discussion r/t 1-way extubation, DNR status    Atrial Fibrillation  Home anticoagulation:  none (previously on Xarelto,  last dose 2 weeks ago)  Being considered for watchman device  Not an AC candidate given recent GI bleeding   Labile Hypotension / Hypertension  Stable pre-IP stroke  BP goal per IR x 24h following IR procedure - 120-140  Low BP during the night, treated w/ phenylephrine. On and off throughout the night, alternating w. Cleviprex  Now on Cleviprex  BP stable now   Hyperlipidemia  Home meds:  pravachol 40, resumed in hospital  LDL 54, goal < 70  Continue statin at discharge  Other Stroke Risk Factors  Advanced age  Coronary artery disease  Congestive heart failure    Other Active Problems  Dementia   GI bleeding, recent, without known source - multiple recent hospitalizations. Hgb as low as 4.9, transfused. no etiology found on capsule endoscopy. Previous colon and EGD unrevealing. Repeat colon, upper endo and capsule endo neg.   Asthma, on nocturnal O2  Hypothyroidism   Renal dysfunction  Hospital day # 5 Patient unfortunately has had a second stroke and underwent successful right M1 mechanical thrombectomy but given her advanced age and significant aphasia from recent stroke prognosis is quite guarded. Recommend close neurological monitoring and strict blood pressure control as per post intervention protocol. Checked MRI scan of the brain today after rounds which I personally reviewed shows moderate size right MCA infarct involving frontal cortex as well as the basal ganglia. Given patient bilateral infarcts and advanced age and atrial fibrillation pain and anticoagulation candidate her prognosis for making meaningful improvement in having a reasonable quality of life is quite poor. I had a long discussion over the phone with the patient's son/daughter Victoria Oconnell who are both in agreement that patient would not want prolonged ventilatory support, feeding tube or life of significant disability requiring nursing home care which appear unavoidable. They agreed to DNR full  comfort care measures but would like time to visit and stated goodbyes. I am in agreement with plan. Discussed with Dr. Kendrick FriesMcquaid critical care medicine.. This patient is critically ill and at significant risk of neurological worsening, death and care requires constant monitoring of vital signs, hemodynamics,respiratory and cardiac monitoring, extensive review of multiple databases, frequent neurological assessment, discussion with family, other specialists and medical decision making of high complexity.I have made any additions or clarifications directly to the above note.This critical care time does not reflect procedure time, or teaching time or supervisory time of PA/NP/Med Resident etc but could involve care discussion time.  I spent 50 minutes of neurocritical care time  in the care of  this patient.  Victoria HeadyPramod Kalin Amrhein, MD    To contact Stroke Continuity provider, please refer to [email or web address]. After hours,  contact General Neurology

## 2020-04-24 NOTE — Significant Event (Signed)
Rapid Response Event Note   Reason for Call :  Called to bedside for stroke like symptoms  Initial Focused Assessment:  Patient LKW 289 with sudden onset of aphasia, left sided neglect and weakness, sensory deficit and dysarthria and complaining of headache, with dry heaves. BP 160's   Hx afib, gi bleed, onset dementia, prior stroke two days ago   Interventions:  CT head STAT, CODE STROKE called   Plan of Care:  Transported to IR for thrombectomy   Event Summary:   MD Notified: Dr Ezzie Dural responded to code strok Call Time: 2203 Arrival Time: 2205 End Time: 0000  Samuel Bouche, RN

## 2020-04-24 NOTE — Progress Notes (Signed)
Patient ID: Victoria Oconnell, female   DOB: Sep 25, 1929, 86 y.o.   MRN: [id number] INR. *& Y RT F New onset of LT sided weakness with neglect  LSW 945 pm. LN. CT brain No gross hemorrhage. ASPECTS ? Due to motion. CTA occluded RT MCA M 1 seg. Endovascular revascularization D/W son in law and daughter in a 3 way telephone cal. Procedure,reasons and alternatives reviewed. Risks of ICH of 10 %,worsening neuro deficit,death and inability to revascularize reviewed. They expressed understanding and provided consent to the treatment. S.Shalev Helminiak MD

## 2020-04-25 ENCOUNTER — Other Ambulatory Visit: Payer: Self-pay | Admitting: Interventional Radiology

## 2020-04-26 HISTORY — PX: IR PERCUTANEOUS ART THROMBECTOMY/INFUSION INTRACRANIAL INC DIAG ANGIO: IMG6087

## 2020-04-26 NOTE — Progress Notes (Signed)
Pt transported to morgue, no belongings at bedside.

## 2020-04-26 NOTE — Progress Notes (Signed)
STROKE TEAM PROGRESS NOTE   INTERVAL HISTORY     Patient was made comfort care upon request from family yesterday.  She is lying comfortably in bed on morphine drip.  She is unresponsive.  No family members at the bedside.  Vitals:   04/11/2020 0400 04/07/2020 0500 04/14/2020 0600 04/22/2020 0700  BP:  (!) 68/34    Pulse: 76 87 84 94  Resp: (!) 9 (!) 7 (!) 9 (!) 9  Temp:      TempSrc:      SpO2: (!) 78% (!) 74% 92% 90%  Weight:      Height:       CBC:  Recent Labs  Lab 04/22/20 0321 04/22/20 0321 04/24/20 0311 04/24/20 0610  WBC 7.8  --   --  8.1  NEUTROABS  --   --   --  7.4  HGB 9.6*   < > 8.5* 8.9*  HCT 30.8*   < > 25.0* 29.0*  MCV 89.3  --   --  90.3  PLT 369  --   --  319   < > = values in this interval not displayed.   Basic Metabolic Panel:  Recent Labs  Lab 04/22/20 0321 04/22/20 0321 04/24/20 0311 04/24/20 0610  NA 133*   < > 137 136  K 4.2   < > 4.0 3.8  CL 102  --   --  106  CO2 24  --   --  21*  GLUCOSE 95  --   --  158*  BUN 10  --   --  10  CREATININE 0.97  --   --  0.77  CALCIUM 8.6*  --   --  7.8*   < > = values in this interval not displayed.   Lipid Panel:  Recent Labs  Lab 04/21/20 0530 04/21/20 0530 04/24/20 0610  CHOL 105  --   --   TRIG 58   < > 68  HDL 39*  --   --   CHOLHDL 2.7  --   --   VLDL 12  --   --   LDLCALC 54  --   --    < > = values in this interval not displayed.   HgbA1c:  Recent Labs  Lab 04/21/20 0530  HGBA1C 5.3   Urine Drug Screen: No results for input(s): LABOPIA, COCAINSCRNUR, LABBENZ, AMPHETMU, THCU, LABBARB in the last 168 hours.  Alcohol Level No results for input(s): ETH in the last 168 hours.  IMAGING past 24 hours  DG Abd 1 View  Result Date: 04/24/2020 CLINICAL DATA:  Orogastric tube placement EXAM: ABDOMEN - 1 VIEW COMPARISON:  April 22, 2020 FINDINGS: Orogastric tube tip and side port in stomach. There is no bowel dilatation or air-fluid level to suggest bowel obstruction. No free air. Contrast  is noted in each renal collecting system and ureter. There is lumbar levoscoliosis with degenerative change. There is atelectatic change in the left lung base. IMPRESSION: Orogastric tube tip and side port in stomach. No evident bowel obstruction or free air. Electronically Signed   By: Bretta Bang III M.D.   On: 04/24/2020 13:39   MR BRAIN WO CONTRAST  Result Date: 04/24/2020 CLINICAL DATA:  Stroke follow-up. EXAM: MRI HEAD WITHOUT CONTRAST TECHNIQUE: Multiplanar, multiecho pulse sequences of the brain and surrounding structures were obtained without intravenous contrast. COMPARISON:  MRI of the brain April 22, 2020. FINDINGS: Brain: New area of restricted diffusion involving the anterior right temporal lobe, insula  and basal ganglia with faint corresponding hyperintensity on FLAIR sequence suggesting hyperacute infarct. No evidence of hemorrhagic transformation or significant mass effect. Foci of susceptibility artifact in the right basal ganglia was present on prior study. Again seen is scattered foci of restricted diffusion in the left parieto-occipital and temporal region, consistent with acute/subacute infarct. Scattered foci of T2 hyperintensity are seen within the white the cerebral hemispheres, nonspecific, likely related to small vessel ischemia. Hydrocephalus, intracranial hemorrhage, mass or midline shift. Vascular: Normal flow voids. Skull and upper cervical spine: Normal marrow signal. Sinuses/Orbits: Bilateral lens surgery. Small mucous retention cyst in the left maxillary sinus. Mucosal thickening of the ethmoid cells. Other: None. IMPRESSION: 1. New area of restricted diffusion involving the anterior right temporal lobe, insula and basal ganglia with faint corresponding hyperintensity on FLAIR sequence suggesting hyperacute infarct. No evidence of hemorrhagic transformation or significant mass effect. 2. Stable scattered foci of restricted diffusion in the left parieto-occipital and  temporal region, consistent with acute/subacute infarct. 3. Mild chronic small vessel ischemia. Electronically Signed   By: Baldemar Lenis M.D.   On: 04/24/2020 12:38   Neuro Interventional Radiology - Cerebral Angiogram with Intervention - Dr Corliss Skains 04/24/20 - 2:00 AM S/P RT commoon carotid arteriogram  With complete revascularization of occluded Rt MCA M 1 seg with x 1 pass with 5 mm x 91mm embotrap retriever and  Aspiration achieving a TICI 3 revascularization. Post procedure CT brain No ICH or mass effect. 47F exoseal applied to RT groin puncture for hemostasis. Distal pulses all dopplerable. Patient left intubated to protect airway.    PHYSICAL EXAM    Frail elderly Caucasian lady who is sedated on morphine drip    Neurological Exam : She is sedated on morphine drip. Eyes are closed. She has right gaze preference.. She does not follow any commands. Face is symmetric.  Tongue midline.  Motor system exam able to move all 4 extremities to sternal rub.  .  Gait not tested.   ASSESSMENT/PLAN [name] is a 86 y.o. female with history of atrial fibrillation not on anticoagulation due to bleeding being considered for a watchman device for stroke prevention, mild dementia, coronary artery disease, congestive heart failure, hypertension, being treated in the hospital for a GI bleed who developed sudden change in mentation and speech.   Stroke:  2nd inhospital stroke 04/19/2020 - R MCA infarct due to R M1 occlusion from AF not on [facility], sent to IR w/ TICI3 revascularization   CT Code stroke 9/28 motion - no acute abnormality  CTA head & neck 9/28 multifocal occlusion R MCA including M1. R ICA origin 50% stenosis. Aortic atherosclerosis.   Cerebral angio / IR 9/29 TICI3 revascularization R M1 w/ embotrap   Post IR CT 9/29 no ICH or mass effect   MRI brain 9/29 new anterior R temporal lobe, insula and basal ganglia infarct. Stable L parieto-occipital and temporal infarct.  Small vessel disease.   VTE prophylaxis - SCDs   Aspirin 81 mg daily prior to admission, now on No antithrombotic given anemia requiring transfusion. (previously on Xarelto, last dose 2 weeks ago)  Therapy recommendations:  HH PT, HH OT, HH SLP   Disposition:  pending   DNR - reversed for IR    Stroke:  1st inhospital stroke 9/26 - Large left parietal embolic infarct d/t AF not on AC. Also has a subacute L subcortical infarct likely age-indeterminate and from small vessel disease.    Code Stroke CT head No  acute abnormality. ASPECTS 10.     MRI / MRA  Tiny Acute/subacute L frontal cortex and L periventricular white matter infarcts. Old B basal ganglia hemorrhages. Old L corona radiata infarct. Small vessel disease. Atrophy. L M2 occlusion.  Repeat limited MRI 9/27 new large acute/subacute cortical / subcortical L parietal lobe infarct    Carotid Doppler  B ICA 1-39% stenosis, VAs antegrade   2D Echo w/ bubble pending   EEG generalized slowing  LDL 54  HgbA1c 5.3  Acute Respiratory Failure  Intubated for IR, left intubated to protect airway  Sedated  CCM on board   Need family discussion r/t 1-way extubation, DNR status    Atrial Fibrillation  Home anticoagulation:  none (previously on Xarelto, last dose 2 weeks ago)  Being considered for watchman device  Not an AC candidate given recent GI bleeding   Labile Hypotension / Hypertension  Stable pre-IP stroke  BP goal per IR x 24h following IR procedure - 120-140  Low BP during the night, treated w/ phenylephrine. On and off throughout the night, alternating w. Cleviprex  Now on Cleviprex  BP stable now   Hyperlipidemia  Home meds:  pravachol 40, resumed in hospital  LDL 54, goal < 70  Continue statin at discharge  Other Stroke Risk Factors  Advanced age  Coronary artery disease  Congestive heart failure    Other Active Problems  Dementia   GI bleeding, recent, without known source -  multiple recent hospitalizations. Hgb as low as 4.9, transfused. no etiology found on capsule endoscopy. Previous colon and EGD unrevealing. Repeat colon, upper endo and capsule endo neg.   Asthma, on nocturnal O2  Hypothyroidism   Hx of renal dysfunction - creatinine - 0.77  Anemia - Hgb - 9.6->8.5->8.9  Code Status - DNR   Hospital day # 6 Patient has been made comfort care measures only and is resting peacefully on morphine drip.  Recommend transfer to 6 [address]. hospice floor.  No family available at the bedside.  Discussed with patient's RN at the bedside and answered questions.  Greater than 50% time during this 25-minute visit was spent on counseling and coordination of care and discussion with care team.  Delia Heady, MD   To contact Stroke Continuity provider, please refer to [email or web address]. After hours, contact General Neurology

## 2020-04-26 NOTE — Progress Notes (Signed)
   04/16/2020 0855  Clinical Encounter Type  Visited With Patient;Health care provider  Visit Type Initial;Patient actively dying  Referral From Nurse  Consult/Referral To Chaplain   Chaplain checked in with Pt's nurse. No family currently at bedside. Chaplain gave nurse the on-call number, if needed.  This note was prepared by Chaplain Resident, Tacy Learn, MDiv. Chaplain remains available as needed through the on-call pager: [phone].

## 2020-04-26 DEATH — deceased

## 2020-04-30 ENCOUNTER — Encounter (HOSPITAL_COMMUNITY): Payer: Self-pay

## 2020-04-30 ENCOUNTER — Other Ambulatory Visit (HOSPITAL_COMMUNITY): Payer: Self-pay | Admitting: Neurology

## 2020-04-30 DIAGNOSIS — I639 Cerebral infarction, unspecified: Secondary | ICD-10-CM

## 2020-04-30 HISTORY — PX: IR CT HEAD LTD: IMG2386

## 2020-05-27 NOTE — Death Summary Note (Signed)
Patient ID: Victoria Oconnell MRN: [id number] DOB/AGE: 1930-05-09 86 y.o.  Admit date: 09-May-2020 Death date: May 17, 2020  Admission Diagnoses: Confusion, change in mental status and aphasia  Cause of Death: Respiratory failure due to large right MCA infarct after left MCA branch infarct few days ago from atrial fibrillation.  Patient made DNR and comfort care and ventilatory support withdrawn upon family wishes  Pertinent Medical Diagnosis: Principal Problem:   Acute GI bleeding Active Problems:   Essential hypertension   Diverticulosis   CHF (congestive heart failure) (HCC)   CAD (coronary artery disease)   Atrial fibrillation (HCC)   Asthma   Dependence on nocturnal oxygen therapy   Dementia (HCC)   Anemia due to blood loss   Cerebral embolism with cerebral infarction   Acute right MCA stroke (HCC)   Middle cerebral artery embolism, right   Hospital Course: Victoria Oconnell is a 86 y.o. female past medical history of atrial fibrillation not on anticoagulation due to bleeding being considered for a watchman device for stroke prevention, dementia which is mild, coronary artery disease, congestive heart failure, hypertension, being treated in the hospital for a GI bleed, had a sudden onset of change in mentation-speech became different than baseline. According to the patient's RN last known normal was 10:40 AM when she was with physical therapy and in her usual state of health. RN call button was activated and when they went to check on her, her speech sounded a little different.  She was not responding appropriately to questions. Rapid response was called.  Code stroke was initiated. Patient was not able to provide good history. [name], the family member, provided history that they were concerned for her high risk of stroke from atrial fibrillation and not being on anticoagulation due to GI bleeds and that is why she was being considered for watchman device. Initial MRI scan showed a  tiny left periventricular white matter weakly diffusion positive lesion as well as left frontal premotor cortical diffusion hyperintensity suggesting possible embolic strokes.  She was not given TPA as she presented outside the time window and had active GI bleeding 2 days prior.  She remains aphasic however an x-ray showed mild improvement.  MRI scan of the brain was repeated which now showed left parietal MCA branch infarct which had not been seen on the previous MRI.  Unfortunately that night her condition got worse and she was found to have dense left hemiplegia gaze deviation and neglect.  This time CT angiogram showed a new embolus in the right middle cerebral artery.  After discussion with family about risk benefits and goals of care she underwent emergent mechanical thrombectomy of the right M1 by Dr. Corliss Skains.  She was kept in the ICU after the procedure.  Next day MRI was repeated which showed moderate size right MCA infarct in addition to her recent left MCA infarct as well.  Patient was found to be aphasic and not following commands.  Her prognosis was felt to be poor and she would likely need prolonged ventilatory support if not at least feeding tube.  Family felt patient was fully independent prior to this strokes and would not want to live her life of disability and be kept alive by breathing tube and feeding tubes and still made her DNR and full comfort care measures only.  Ventilatory support was withdrawn and she was kept comfortable and on morphine drip.  Her condition gradually declined and she passed away on 05-17-2020 peacefully.  Family were notified Signed: Janalyn Shy  Sarita Hakanson 04/26/2020, 1:51 PM
